# Patient Record
Sex: Female | Born: 1950 | Race: White | Hispanic: No | Marital: Married | Smoking: Never smoker
Health system: Southern US, Community
[De-identification: ages and names within clinical notes are randomized; demographics above are authoritative.]

## PROBLEM LIST (undated history)

## (undated) DIAGNOSIS — K589 Irritable bowel syndrome without diarrhea: Secondary | ICD-10-CM

## (undated) DIAGNOSIS — T7840XA Allergy, unspecified, initial encounter: Secondary | ICD-10-CM

## (undated) DIAGNOSIS — Z860101 Personal history of adenomatous and serrated colon polyps: Secondary | ICD-10-CM

## (undated) DIAGNOSIS — M810 Age-related osteoporosis without current pathological fracture: Secondary | ICD-10-CM

## (undated) DIAGNOSIS — Z86010 Personal history of colonic polyps: Secondary | ICD-10-CM

## (undated) DIAGNOSIS — G43909 Migraine, unspecified, not intractable, without status migrainosus: Secondary | ICD-10-CM

## (undated) DIAGNOSIS — N3281 Overactive bladder: Secondary | ICD-10-CM

## (undated) DIAGNOSIS — K219 Gastro-esophageal reflux disease without esophagitis: Secondary | ICD-10-CM

## (undated) DIAGNOSIS — G2581 Restless legs syndrome: Secondary | ICD-10-CM

## (undated) DIAGNOSIS — H04123 Dry eye syndrome of bilateral lacrimal glands: Secondary | ICD-10-CM

## (undated) DIAGNOSIS — J302 Other seasonal allergic rhinitis: Secondary | ICD-10-CM

## (undated) DIAGNOSIS — L659 Nonscarring hair loss, unspecified: Secondary | ICD-10-CM

## (undated) DIAGNOSIS — Z800 Family history of malignant neoplasm of digestive organs: Secondary | ICD-10-CM

## (undated) DIAGNOSIS — R011 Cardiac murmur, unspecified: Secondary | ICD-10-CM

## (undated) DIAGNOSIS — M170 Bilateral primary osteoarthritis of knee: Secondary | ICD-10-CM

## (undated) DIAGNOSIS — B029 Zoster without complications: Secondary | ICD-10-CM

## (undated) DIAGNOSIS — I10 Essential (primary) hypertension: Secondary | ICD-10-CM

## (undated) DIAGNOSIS — Z87898 Personal history of other specified conditions: Secondary | ICD-10-CM

## (undated) DIAGNOSIS — Z8742 Personal history of other diseases of the female genital tract: Secondary | ICD-10-CM

## (undated) DIAGNOSIS — F419 Anxiety disorder, unspecified: Secondary | ICD-10-CM

## (undated) DIAGNOSIS — M1712 Unilateral primary osteoarthritis, left knee: Secondary | ICD-10-CM

## (undated) DIAGNOSIS — I8390 Asymptomatic varicose veins of unspecified lower extremity: Secondary | ICD-10-CM

## (undated) DIAGNOSIS — B001 Herpesviral vesicular dermatitis: Secondary | ICD-10-CM

## (undated) DIAGNOSIS — R002 Palpitations: Secondary | ICD-10-CM

## (undated) DIAGNOSIS — K2970 Gastritis, unspecified, without bleeding: Secondary | ICD-10-CM

## (undated) DIAGNOSIS — K625 Hemorrhage of anus and rectum: Secondary | ICD-10-CM

## (undated) HISTORY — DX: Cardiac murmur, unspecified: R01.1

## (undated) HISTORY — DX: Hemorrhage of anus and rectum: K62.5

## (undated) HISTORY — DX: Personal history of other specified conditions: Z87.898

## (undated) HISTORY — DX: Gastritis, unspecified, without bleeding: K29.70

## (undated) HISTORY — DX: Palpitations: R00.2

## (undated) HISTORY — PX: ABDOMINAL HYSTERECTOMY: SHX81

## (undated) HISTORY — PX: COLONOSCOPY: SHX174

## (undated) HISTORY — DX: Allergy, unspecified, initial encounter: T78.40XA

## (undated) HISTORY — DX: Gastro-esophageal reflux disease without esophagitis: K21.9

## (undated) HISTORY — DX: Other seasonal allergic rhinitis: J30.2

## (undated) HISTORY — DX: Zoster without complications: B02.9

## (undated) HISTORY — DX: Personal history of colonic polyps: Z86.010

## (undated) HISTORY — DX: Essential (primary) hypertension: I10

## (undated) HISTORY — DX: Anxiety disorder, unspecified: F41.9

## (undated) HISTORY — DX: Family history of malignant neoplasm of digestive organs: Z80.0

## (undated) HISTORY — DX: Overactive bladder: N32.81

## (undated) HISTORY — DX: Asymptomatic varicose veins of unspecified lower extremity: I83.90

## (undated) HISTORY — PX: POLYPECTOMY: SHX149

## (undated) HISTORY — DX: Personal history of other diseases of the female genital tract: Z87.42

## (undated) HISTORY — DX: Nonscarring hair loss, unspecified: L65.9

## (undated) HISTORY — DX: Herpesviral vesicular dermatitis: B00.1

## (undated) HISTORY — PX: OTHER SURGICAL HISTORY: SHX169

## (undated) HISTORY — DX: Age-related osteoporosis without current pathological fracture: M81.0

## (undated) HISTORY — DX: Bilateral primary osteoarthritis of knee: M17.0

## (undated) HISTORY — DX: Migraine, unspecified, not intractable, without status migrainosus: G43.909

## (undated) HISTORY — DX: Irritable bowel syndrome, unspecified: K58.9

## (undated) HISTORY — DX: Dry eye syndrome of bilateral lacrimal glands: H04.123

## (undated) HISTORY — DX: Restless legs syndrome: G25.81

## (undated) HISTORY — DX: Personal history of adenomatous and serrated colon polyps: Z86.0101

---
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
## 1974-02-18 DIAGNOSIS — B029 Zoster without complications: Secondary | ICD-10-CM

## 1974-02-18 HISTORY — DX: Zoster without complications: B02.9

## 1981-02-18 HISTORY — PX: APPENDECTOMY: SHX54

## 2008-02-19 HISTORY — PX: OTHER SURGICAL HISTORY: SHX169

## 2012-02-19 HISTORY — PX: CHOLECYSTECTOMY, LAPAROSCOPIC: SHX56

## 2013-05-19 HISTORY — PX: ROBOTIC ASSISTED LAPAROSCOPIC SACROCOLPOPEXY: SHX5388

## 2013-07-19 HISTORY — PX: ESOPHAGOGASTRODUODENOSCOPY: SHX1529

## 2014-02-15 ENCOUNTER — Encounter: Payer: Self-pay | Admitting: Family Medicine

## 2014-02-23 ENCOUNTER — Encounter: Payer: Self-pay | Admitting: Family Medicine

## 2014-02-23 ENCOUNTER — Ambulatory Visit (INDEPENDENT_AMBULATORY_CARE_PROVIDER_SITE_OTHER): Payer: Federal, State, Local not specified - PPO | Admitting: Family Medicine

## 2014-02-23 VITALS — BP 162/87 | HR 74 | Temp 98.6°F | Resp 18 | Ht 67.0 in | Wt 147.0 lb

## 2014-02-23 DIAGNOSIS — G2581 Restless legs syndrome: Secondary | ICD-10-CM | POA: Insufficient documentation

## 2014-02-23 DIAGNOSIS — Z23 Encounter for immunization: Secondary | ICD-10-CM

## 2014-02-23 DIAGNOSIS — I10 Essential (primary) hypertension: Secondary | ICD-10-CM

## 2014-02-23 DIAGNOSIS — Z0000 Encounter for general adult medical examination without abnormal findings: Secondary | ICD-10-CM | POA: Insufficient documentation

## 2014-02-23 DIAGNOSIS — N3941 Urge incontinence: Secondary | ICD-10-CM | POA: Insufficient documentation

## 2014-02-23 MED ORDER — OXYBUTYNIN CHLORIDE ER 10 MG PO TB24: 10.0000 mg | ORAL_TABLET | Freq: Every day | ORAL | Status: DC

## 2014-02-23 MED ORDER — LOSARTAN POTASSIUM 100 MG PO TABS
100.0000 mg | ORAL_TABLET | Freq: Every day | ORAL | Status: DC
Start: 2014-02-23 — End: 2015-03-26

## 2014-02-23 MED ORDER — ROPINIROLE HCL 1 MG PO TABS: ORAL_TABLET | ORAL | Status: DC

## 2014-02-23 NOTE — Assessment & Plan Note (Addendum)
Off med x 1 wk so bp up today. Usually well controlled on losartan 100mg  qd. RF'd med today.  She continue home monitoring.

## 2014-02-23 NOTE — Progress Notes (Signed)
Office Note 02/23/2014  CC:  Chief Complaint  Patient presents with  . Establish Care    moved from [facility]   HPI:  Crystal Murray is a 64 y.o. White female who is here to establish care. Patient's most recent primary MD: Dr. Lorenz Coaster in [place], [place]. Old records were not reviewed prior to or during today's visit.  Needs refill of oxybutinynin EF , 1 qd. Takes famvir  for recurrent herpes labialis. Has been out of losartan for 1 wk: needs RF.  She recalls bp being well controlled on this dosing.  Has been titrating ropinirole: now at 0.5mg  bid.   She is much improved on this dosing.   She has both upper body and lower body symptomatology of this disorder (burning, tingling, restlessness).  Tetanus UTD: 2013 Last mammogram 01/10/13  Reviewed CMET, UA, and lipid panel brought in by pt from 08/2013 at previous MD: all wnl.   Past Medical History  Diagnosis Date  . Osteoarthritis of both knees     "bone on bone" per pt  . GERD (gastroesophageal reflux disease)   . Seasonal allergic rhinitis   . Hypertension   . Urge incontinence   . Restless legs syndrome   . Herpes zoster 1976  . Migraine syndrome     Tylenol x 2 onset, then tylenol #4 if no help, then triptan if no help.  Marland Kitchen Alopecia   . Varicose veins   . History of vertigo   . Palpitations     Echo/Holter monitoring; cardiology eval 2015 all normal.  . Recurrent herpes labialis   . Dry eyes     Past Surgical History  Procedure Laterality Date  . Robotic assisted laparoscopic sacrocolpopexy  05/2013  . Appendectomy  1983  . Tonsillectomy and adenoidectomy  1966  . Abdominal hysterectomy  1980s    endometriosis  . Ovaries and tubes removed  1980s    a few years after uterus removed.  . Bladder tack  2010    vaginally, with mesh  . Colonoscopy  1989, 1992; 1997; q 5 yrs    Most recent 07/2010, hyperplastic polyp---recommended repeat 5-7 yrs  . Esophagogastroduodenoscopy  07/2013    gastritis; prilosec  started    Family History  Problem Relation Age of Onset  . Colon cancer Mother 26  . CVA Father 64  . CAD  22    History   Social History  . Marital Status: Married    Spouse Name: N/A    Number of Children: N/A  . Years of Education: N/A   Occupational History  . Not on file.   Social History Main Topics  . Smoking status: Never Smoker   . Smokeless tobacco: Not on file  . Alcohol Use: Not on file  . Drug Use: Not on file  . Sexual Activity: Not on file   Other Topics Concern  . Not on file   Social History Narrative   Married, has 2 sons, 1 grandson.   Relocated from [place], living in [place].   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   No T/A/Ds.    Outpatient Encounter Prescriptions as of 02/23/2014  Medication Sig  . almotriptan (AXERT) 12.5 MG tablet Take 12.5 mg by mouth as needed for migraine. may repeat in 2 hours if needed  . aspirin 81 MG tablet Take 81 mg by mouth daily.  . Biotin 5000 MCG CAPS Take 5,000 mcg by mouth.  . Calcium Carbonate-Vitamin D (CALCIUM + D PO) Take by mouth.  Marland Kitchen  famciclovir (FAMVIR) 125 MG tablet Take 50 mg by mouth daily.  Marland Kitchen loratadine (CLARITIN) 10 MG tablet Take 10 mg by mouth daily.  Marland Kitchen losartan (COZAAR) 100 MG tablet Take 1 tablet (100 mg total) by mouth daily.  . Multiple Vitamin (MULTIVITAMIN) tablet Take 1 tablet by mouth daily.  . naproxen sodium (ANAPROX) 220 MG tablet Take 220 mg by mouth 2 (two) times daily with a meal.  . Omega-3 Fatty Acids (FISH OIL) 1200 MG CAPS Take 1,200 mg by mouth.  Marland Kitchen omeprazole (PRILOSEC) 40 MG capsule   . oxybutynin (DITROPAN-XL) 10 MG 24 hr tablet Take 1 tablet (10 mg total) by mouth at bedtime.  . triamcinolone (NASACORT ALLERGY 24HR) 55 MCG/ACT AERO nasal inhaler Place 2 sprays into the nose daily.  . vitamin E 400 UNIT capsule Take 400 Units by mouth daily.  . [DISCONTINUED] losartan (COZAAR) 100 MG tablet Take 100 mg by mouth daily.  . [DISCONTINUED] oxybutynin (DITROPAN-XL) 10 MG 24 hr  tablet   . [DISCONTINUED] rOPINIRole (REQUIP) 0.5 MG tablet   . rOPINIRole (REQUIP) 1 MG tablet 1-2 tabs po bid    Allergies  Allergen Reactions  . Amitiza [Lubiprostone] Hives and Shortness Of Breath  . Biaxin [Clarithromycin] Hives and Shortness Of Breath  . Iodine Swelling    Mouth and throat swelling  . Penicillins Hives and Shortness Of Breath  . Sulfa Antibiotics Hives and Shortness Of Breath  . Versed [Midazolam]   . Zithromax [Azithromycin] Hives and Shortness Of Breath  . Brimonidine Tartrate Itching and Swelling  . Lisinopril     Hair loss  . Cefdinir Itching and Rash  . Levaquin [Levofloxacin In D5w] Itching and Rash    ROS Review of Systems  Constitutional: Negative for fever and fatigue.  HENT: Negative for congestion and sore throat.   Eyes: Negative for visual disturbance.  Respiratory: Negative for cough.   Cardiovascular: Negative for chest pain.  Gastrointestinal: Negative for nausea and abdominal pain.  Genitourinary: Negative for dysuria.  Musculoskeletal: Negative for back pain and joint swelling.  Skin: Negative for rash.  Neurological: Negative for weakness and headaches.  Hematological: Negative for adenopathy.    PE; Blood pressure 162/87, pulse 74, temperature 98.6 F (37 C), temperature source Temporal, resp. rate 18, height  (1.702 m), weight 147 lb (66.679 kg), SpO2 99 %. Gen: Alert, well appearing.  Patient is oriented to person, place, time, and situation. DGU:YQIH: no injection, icteris, swelling, or exudate.  EOMI, PERRLA. Mouth: lips without lesion/swelling.  Oral mucosa pink and moist. Oropharynx without erythema, exudate, or swelling.   Neck - No masses or thyromegaly or limitation in range of motion CV: RRR, no m/r/g.   LUNGS: CTA bilat, nonlabored resps, good aeration in all lung fields. EXT: no clubbing, cyanosis, or edema.    Pertinent labs:  None today  ASSESSMENT AND PLAN:   New pt: obtain old PCP  records.  Restless legs syndrome Improved on ropinirole but not ideally controlled, particularly her shoulders/traps tingling and burning and urge to move upper body/extremities. Increase dose to  bid, titrate up by 0.5mg  per dose q7d prn to max of  bid --hold at this dose until I see her again in 4 wks.  Essential hypertension Off med x 1 wk so bp up today. Usually well controlled on losartan  qd. RF'd med today.  She continue home monitoring.  Urge incontinence Oxybutynin  ER working ok: pt needs RF so I did this today.  Preventative health care  Pt due for mammogram: we'll set this up at next f/u appt in 1 mo. She'll need referral to a GYN surgeon who has expertise in robotic surgery to do routine f/u for her most recent GYN surgery. She'll need GI referral in the next 1-2 yrs for ongoing colon cancer screening for FH of colon cancer.  Flu vaccine IM today.  An After Visit Summary was printed and given to the patient.  Return in about 4 weeks (around 03/23/2014) for 30 min visit f/u RLS and BP.

## 2014-02-23 NOTE — Assessment & Plan Note (Signed)
Pt due for mammogram: we'll set this up at next f/u appt in 1 mo. She'll need referral to a GYN surgeon who has expertise in robotic surgery to do routine f/u for her most recent GYN surgery. She'll need GI referral in the next 1-2 yrs for ongoing colon cancer screening for FH of colon cancer.

## 2014-02-23 NOTE — Assessment & Plan Note (Signed)
Oxybutynin 10mg  ER working ok: pt needs RF so I did this today.

## 2014-02-23 NOTE — Assessment & Plan Note (Signed)
Improved on ropinirole but not ideally controlled, particularly her shoulders/traps tingling and burning and urge to move upper body/extremities. Increase dose to 1mg  bid, titrate up by 0.5mg  per dose q7d prn to max of 2mg  bid --hold at this dose until I see her again in 4 wks.

## 2014-02-23 NOTE — Progress Notes (Signed)
Pre visit review using our clinic review tool, if applicable. No additional management support is needed unless otherwise documented below in the visit note. 

## 2014-03-02 ENCOUNTER — Encounter: Payer: Self-pay | Admitting: Family Medicine

## 2014-03-02 ENCOUNTER — Telehealth: Payer: Self-pay | Admitting: Family Medicine

## 2014-03-02 NOTE — Telephone Encounter (Signed)
emmi emailed °

## 2014-03-30 ENCOUNTER — Ambulatory Visit: Payer: Federal, State, Local not specified - PPO | Admitting: Family Medicine

## 2014-04-13 ENCOUNTER — Encounter: Payer: Self-pay | Admitting: Family Medicine

## 2014-04-13 ENCOUNTER — Ambulatory Visit (INDEPENDENT_AMBULATORY_CARE_PROVIDER_SITE_OTHER): Payer: Federal, State, Local not specified - PPO | Admitting: Family Medicine

## 2014-04-13 VITALS — BP 149/80 | HR 65 | Temp 98.4°F | Ht 67.0 in | Wt 149.0 lb

## 2014-04-13 DIAGNOSIS — N3941 Urge incontinence: Secondary | ICD-10-CM

## 2014-04-13 DIAGNOSIS — I10 Essential (primary) hypertension: Secondary | ICD-10-CM

## 2014-04-13 DIAGNOSIS — Z8719 Personal history of other diseases of the digestive system: Secondary | ICD-10-CM

## 2014-04-13 DIAGNOSIS — Z1239 Encounter for other screening for malignant neoplasm of breast: Secondary | ICD-10-CM

## 2014-04-13 DIAGNOSIS — G629 Polyneuropathy, unspecified: Secondary | ICD-10-CM

## 2014-04-13 DIAGNOSIS — M792 Neuralgia and neuritis, unspecified: Secondary | ICD-10-CM

## 2014-04-13 LAB — BASIC METABOLIC PANEL
BUN: 19 mg/dL (ref 6–23)
CALCIUM: 9.5 mg/dL (ref 8.4–10.5)
CO2: 28 meq/L (ref 19–32)
Chloride: 100 mEq/L (ref 96–112)
Creat: 0.82 mg/dL (ref 0.50–1.10)
Glucose, Bld: 74 mg/dL (ref 70–99)
Potassium: 4.4 mEq/L (ref 3.5–5.3)
Sodium: 139 mEq/L (ref 135–145)

## 2014-04-13 MED ORDER — ROPINIROLE HCL 1 MG PO TABS: ORAL_TABLET | ORAL | Status: DC

## 2014-04-13 NOTE — Progress Notes (Signed)
OFFICE NOTE  04/17/2014  CC:  Chief Complaint  Patient presents with  . Follow-up    HPI: Patient is a 64 y.o. Caucasian female who is here for 6 wk f/u HTN and RLS. Increase of requip during day made her too drowsy so she is currently on 0.5mg  dose in daytime but 1mg  dose about 1 hour before bed.  Gaynelle AduSher finds this regimen satisfactory.  Oxybutynin for overactive bladder--recently restarted: she does have 2 days per week in which her tongue feels like it burns on top and she wonders if it could be this med causing it. Says tongue sometimes appears more red than normal.  Says when she used to take this med in the past she didn't have this problem. Her overactive bladder sx's are improved but not gone.  Tries to limit caffeine and chocolate.  Mirbetriq in the past; not much help and too expensive.  Home bp monitoring: 120s-130s over 60s.  No HR numbers to report.  No HAs, dizziness, CP, or palpitations.  Sides and bottoms of feet burning and stinging for at least 9 mo, comes and goes, but she sees a redness to the bottoms of her feet constantly. Intensity: moderate for the most part, able to be distracted from it throughout the course of her day, but worse at night when not distracted, makes her miserable.  Change in shoes made no difference. Worse after being on feet a long time during day.  No meds have been tried for this.  She has been on prilosec daily x 8 mo for gastritis. She wants to go off of this med.  Denies any current epigastric pain or GERD.    She takes NSAIDs on a fairly regular basis but denies abd discomfort related to this med.  Pertinent PMH:  Past medical, surgical, social, and family history reviewed and no changes are noted since last office visit.  MEDS:  Outpatient Prescriptions Prior to Visit  Medication Sig Dispense Refill  . almotriptan (AXERT) 12.5 MG tablet Take 12.5 mg by mouth as needed for migraine. may repeat in 2 hours if needed    . aspirin 81 MG  tablet Take 81 mg by mouth daily.    . Biotin 5000 MCG CAPS Take 5,000 mcg by mouth.    . Calcium Carbonate-Vitamin D (CALCIUM + D PO) Take by mouth.    . famciclovir (FAMVIR) 125 MG tablet Take 50 mg by mouth daily.    Marland Kitchen. loratadine (CLARITIN) 10 MG tablet Take 10 mg by mouth daily.    Marland Kitchen. losartan (COZAAR) 100 MG tablet Take 1 tablet (100 mg total) by mouth daily. 90 tablet 3  . Multiple Vitamin (MULTIVITAMIN) tablet Take 1 tablet by mouth daily.    . naproxen sodium (ANAPROX) 220 MG tablet Take 220 mg by mouth 2 (two) times daily with a meal.    . Omega-3 Fatty Acids (FISH OIL) 1200 MG CAPS Take 1,200 mg by mouth.    . oxybutynin (DITROPAN-XL) 10 MG 24 hr tablet Take 1 tablet (10 mg total) by mouth at bedtime. 90 tablet 3  . triamcinolone (NASACORT ALLERGY 24HR) 55 MCG/ACT AERO nasal inhaler Place 2 sprays into the nose daily.    . vitamin E 400 UNIT capsule Take 400 Units by mouth daily.    Marland Kitchen. omeprazole (PRILOSEC) 40 MG capsule   2  . rOPINIRole (REQUIP) 1 MG tablet 1-2 tabs po bid 120 tablet 2   No facility-administered medications prior to visit.    PE:  Blood pressure 149/80, pulse 65, temperature 98.4 F (36.9 C), temperature source Temporal, height  (1.702 m), weight 149 lb (67.586 kg), SpO2 100 %. Gen: Alert, well appearing.  Patient is oriented to person, place, time, and situation. ENT: tongue and oral mucosa normal CV: RRR, no m/r/g.   LUNGS: CTA bilat, nonlabored resps, good aeration in all lung fields. Feet: some varicose veins in ankles-noninflamed. No edema.  NO tenderness or swelling.  ROM of ankles and toes normal.  Sensation intact. Bottoms of feet are a deep pink hue.  IMPRESSION AND PLAN:  1) HTN; The current medical regimen is effective;  continue present plan and medications. BMET today (not fasting).  2) Burning feet, unclear etiology.  Question of early peripheral neuropathy, small fiber. Check HbA1c.  May ultimately need empiric trial of gabapentin  amitriptyline or lyrica. May also ultimately need NCS/EMGs but neuro referral may make more sense.  3) Hx of gastritis, now s/p 8 mo of daily PPI. OK to ween off prilosec over the next 1-2 wks.  4) Urge incontinence: fair response to oxybutynin.  I dont think her tongue burning is related to this med, but possibly the tongue burning is coming from the same problem causing her plantar feet burning pain.   5) Hx of robotic assisted laparoscopic sacrocolpopexy.  I gave her info on a new pelvic specialist at [facility] and she says she will look into her and others; she wants to research this and when she finds a Research scientist (medical) of her liking to get established with she'll call for referral assistance if needed.  6) Prev health care: mammogram for breast cancer screening ordered.  An After Visit Summary was printed and given to the patient.  FOLLOW UP: 6 mo

## 2014-04-13 NOTE — Progress Notes (Signed)
Pre visit review using our clinic review tool, if applicable. No additional management support is needed unless otherwise documented below in the visit note. 

## 2014-04-14 LAB — HEMOGLOBIN A1C
Hgb A1c MFr Bld: 5.4 % (ref <5.7)
Mean Plasma Glucose: 108 mg/dL (ref <117)

## 2014-04-29 ENCOUNTER — Encounter: Payer: Self-pay | Admitting: Nurse Practitioner

## 2014-04-29 ENCOUNTER — Ambulatory Visit (INDEPENDENT_AMBULATORY_CARE_PROVIDER_SITE_OTHER): Payer: Federal, State, Local not specified - PPO | Admitting: Nurse Practitioner

## 2014-04-29 VITALS — BP 118/69 | HR 70 | Temp 99.0°F | Ht 67.0 in | Wt 151.0 lb

## 2014-04-29 DIAGNOSIS — R300 Dysuria: Secondary | ICD-10-CM

## 2014-04-29 LAB — POCT URINALYSIS DIPSTICK
BILIRUBIN UA: NEGATIVE
GLUCOSE UA: NEGATIVE
KETONES UA: NEGATIVE
Nitrite, UA: NEGATIVE
PH UA: 6
Protein, UA: NEGATIVE
SPEC GRAV UA: 1.01
Urobilinogen, UA: 0.2

## 2014-04-29 MED ORDER — NITROFURANTOIN MONOHYD MACRO 100 MG PO CAPS: 100.0000 mg | ORAL_CAPSULE | Freq: Two times a day (BID) | ORAL | Status: DC

## 2014-04-29 NOTE — Progress Notes (Signed)
   Subjective:    Patient ID: Crystal Murray, female    DOB: April 29, 1950, 64 y.o.   MRN: [id number]  Dysuria  This is a new problem. The current episode started in the past 7 days (2d). The problem occurs every urination. The problem has been unchanged. The quality of the pain is described as burning. There has been no fever. Associated symptoms include frequency, hematuria and urgency. Pertinent negatives include no chills, discharge, flank pain, hesitancy or nausea. She has tried nothing for the symptoms. bladder tack 11 mos ago      Review of Systems  Constitutional: Negative for chills.  Gastrointestinal: Positive for constipation. Negative for nausea.  Genitourinary: Positive for dysuria, urgency, frequency and hematuria. Negative for hesitancy and flank pain.       Objective:   Physical Exam  Constitutional: She is oriented to person, place, and time. She appears well-developed and well-nourished. No distress.  HENT:  Head: Normocephalic and atraumatic.  Eyes: Conjunctivae are normal. Right eye exhibits no discharge. Left eye exhibits no discharge.  Neck: Normal range of motion.  Cardiovascular: Normal rate.   Pulmonary/Chest: Effort normal.  Abdominal: Soft. She exhibits no distension and no mass. There is tenderness. There is no rebound and no guarding.  LUQ  Musculoskeletal: She exhibits no tenderness (no CVA tenderness).  Neurological: She is alert and oriented to person, place, and time.  Skin: Skin is warm and dry.  Psychiatric: She has a normal mood and affect. Her behavior is normal. Thought content normal.  Vitals reviewed.         Assessment & Plan:  1. Dysuria - POCT urinalysis dipstick-mod blood, leuks - Urine culture - nitrofurantoin, macrocrystal-monohydrate, (MACROBID) 100 MG capsule; Take 1 capsule (100 mg total) by mouth 2 (two) times daily.  Dispense: 10 capsule; Refill: 0  See pt instructions. F/u PRN

## 2014-04-29 NOTE — Patient Instructions (Signed)
Start antibiotic. Our office will call if we need to change the antibiotic. Sip hydrating fluids (water, juice, colorless soda, decaff tea) every hour to flush kidneys. Return in 1 month to recheck urine or sooner if symptoms do not improve or you feel worse.  Urinary Tract Infection Urinary tract infections (UTIs) can develop anywhere along your urinary tract. Your urinary tract is your body's drainage system for removing wastes and extra water. Your urinary tract includes two kidneys, two ureters, a bladder, and a urethra. Your kidneys are a pair of bean-shaped organs. Each kidney is about the size of your fist. They are located below your ribs, one on each side of your spine. CAUSES Infections are caused by microbes, which are microscopic organisms, including fungi, viruses, and bacteria. These organisms are so small that they can only be seen through a microscope. Bacteria are the microbes that most commonly cause UTIs. SYMPTOMS  Symptoms of UTIs may vary by age and gender of the patient and by the location of the infection. Symptoms in young women typically include a frequent and intense urge to urinate and a painful, burning feeling in the bladder or urethra during urination. Older women and men are more likely to be tired, shaky, and weak and have muscle aches and abdominal pain. A fever may mean the infection is in your kidneys. Other symptoms of a kidney infection include pain in your back or sides below the ribs, nausea, and vomiting. DIAGNOSIS To diagnose a UTI, your caregiver will ask you about your symptoms. Your caregiver also will ask to provide a urine sample. The urine sample will be tested for bacteria and white blood cells. White blood cells are made by your body to help fight infection. TREATMENT  Typically, UTIs can be treated with medication. Because most UTIs are caused by a bacterial infection, they usually can be treated with the use of antibiotics. The choice of antibiotic and  length of treatment depend on your symptoms and the type of bacteria causing your infection. HOME CARE INSTRUCTIONS  If you were prescribed antibiotics, take them exactly as your caregiver instructs you. Finish the medication even if you feel better after you have only taken some of the medication.  Drink enough water and fluids to keep your urine clear or pale yellow.  Avoid caffeine, tea, and carbonated beverages. They tend to irritate your bladder.  Empty your bladder often. Avoid holding urine for long periods of time.  Empty your bladder before and after sexual intercourse.  After a bowel movement, women should cleanse from front to back. Use each tissue only once. SEEK MEDICAL CARE IF:   You have back pain.  You develop a fever.  Your symptoms do not begin to resolve within 3 days. SEEK IMMEDIATE MEDICAL CARE IF:   You have severe back pain or lower abdominal pain.  You develop chills.  You have nausea or vomiting.  You have continued burning or discomfort with urination. MAKE SURE YOU:   Understand these instructions.  Will watch your condition.  Will get help right away if you are not doing well or get worse. Document Released: 11/14/2004 Document Revised: 08/06/2011 Document Reviewed: 03/15/2011 Exit[employer] Patient Information 2014 [employer].  

## 2014-04-29 NOTE — Progress Notes (Signed)
Pre visit review using our clinic review tool, if applicable. No additional management support is needed unless otherwise documented below in the visit note. 

## 2014-05-02 LAB — URINE CULTURE: Colony Count: >=100000

## 2014-05-13 ENCOUNTER — Ambulatory Visit: Payer: Federal, State, Local not specified - PPO

## 2014-06-13 ENCOUNTER — Telehealth: Payer: Self-pay | Admitting: Family Medicine

## 2014-06-13 ENCOUNTER — Ambulatory Visit
Admission: RE | Admit: 2014-06-13 | Discharge: 2014-06-13 | Disposition: A | Discharge status: Home / Self Care | Payer: Federal, State, Local not specified - PPO | Source: Ambulatory Visit | Attending: Family Medicine | Admitting: Family Medicine

## 2014-06-13 DIAGNOSIS — Z1239 Encounter for other screening for malignant neoplasm of breast: Secondary | ICD-10-CM

## 2014-06-13 NOTE — Telephone Encounter (Signed)
Pls call pt and ask her to schedule a 30 min o/v for medical clearance for her knee surgery that is scheduled for this July (this appt can be done sometime in the next 4-6 weeks--pls ask her to fast for this appt.-thx

## 2014-06-14 NOTE — Telephone Encounter (Signed)
Appointment scheduled 07/20/14 at 10am.

## 2014-06-15 ENCOUNTER — Encounter: Payer: Self-pay | Admitting: Family Medicine

## 2014-06-17 ENCOUNTER — Encounter: Payer: Self-pay | Admitting: Family Medicine

## 2014-07-07 ENCOUNTER — Other Ambulatory Visit: Payer: Self-pay | Admitting: Orthopedic Surgery

## 2014-07-20 ENCOUNTER — Encounter: Payer: Self-pay | Admitting: Family Medicine

## 2014-07-20 ENCOUNTER — Ambulatory Visit (INDEPENDENT_AMBULATORY_CARE_PROVIDER_SITE_OTHER): Payer: Federal, State, Local not specified - PPO | Admitting: Family Medicine

## 2014-07-20 VITALS — BP 127/78 | HR 63 | Temp 97.7°F | Resp 16 | Wt 154.0 lb

## 2014-07-20 DIAGNOSIS — I10 Essential (primary) hypertension: Secondary | ICD-10-CM

## 2014-07-20 DIAGNOSIS — Z8679 Personal history of other diseases of the circulatory system: Secondary | ICD-10-CM

## 2014-07-20 DIAGNOSIS — M170 Bilateral primary osteoarthritis of knee: Secondary | ICD-10-CM

## 2014-07-20 DIAGNOSIS — E539 Vitamin B deficiency, unspecified: Secondary | ICD-10-CM

## 2014-07-20 DIAGNOSIS — Z13220 Encounter for screening for lipoid disorders: Secondary | ICD-10-CM

## 2014-07-20 DIAGNOSIS — Z87898 Personal history of other specified conditions: Secondary | ICD-10-CM

## 2014-07-20 LAB — LIPID PANEL
Cholesterol: 184 mg/dL (ref 0–200)
HDL: 63.6 mg/dL (ref >39.00)
LDL CALC: 93 mg/dL (ref 0–99)
NonHDL: 120.4
Total CHOL/HDL Ratio: 3
Triglycerides: 137 mg/dL (ref 0.0–149.0)
VLDL: 27.4 mg/dL (ref 0.0–40.0)

## 2014-07-20 LAB — COMPREHENSIVE METABOLIC PANEL
ALT: 15 U/L (ref 0–35)
AST: 20 U/L (ref 0–37)
Albumin: 4.3 g/dL (ref 3.5–5.2)
Alkaline Phosphatase: 62 U/L (ref 39–117)
BUN: 20 mg/dL (ref 6–23)
CALCIUM: 9.5 mg/dL (ref 8.4–10.5)
CHLORIDE: 106 meq/L (ref 96–112)
CO2: 32 meq/L (ref 19–32)
CREATININE: 0.78 mg/dL (ref 0.40–1.20)
GFR: 79.05 mL/min (ref >60.00)
GLUCOSE: 90 mg/dL (ref 70–99)
POTASSIUM: 4.8 meq/L (ref 3.5–5.1)
Sodium: 141 mEq/L (ref 135–145)
Total Bilirubin: 0.5 mg/dL (ref 0.2–1.2)
Total Protein: 6.6 g/dL (ref 6.0–8.3)

## 2014-07-20 LAB — CBC WITH DIFFERENTIAL/PLATELET
BASOS PCT: 0.3 % (ref 0.0–3.0)
Basophils Absolute: 0 10*3/uL (ref 0.0–0.1)
EOS PCT: 1.5 % (ref 0.0–5.0)
Eosinophils Absolute: 0.1 10*3/uL (ref 0.0–0.7)
HCT: 43 % (ref 36.0–46.0)
Hemoglobin: 14 g/dL (ref 12.0–15.0)
LYMPHS ABS: 1.8 10*3/uL (ref 0.7–4.0)
Lymphocytes Relative: 26.3 % (ref 12.0–46.0)
MCHC: 32.5 g/dL (ref 30.0–36.0)
MCV: 89.7 fl (ref 78.0–100.0)
Monocytes Absolute: 0.3 10*3/uL (ref 0.1–1.0)
Monocytes Relative: 5.2 % (ref 3.0–12.0)
NEUTROS ABS: 4.5 10*3/uL (ref 1.4–7.7)
NEUTROS PCT: 66.7 % (ref 43.0–77.0)
Platelets: 180 10*3/uL (ref 150.0–400.0)
RBC: 4.8 Mil/uL (ref 3.87–5.11)
RDW: 13.7 % (ref 11.5–15.5)
WBC: 6.7 10*3/uL (ref 4.0–10.5)

## 2014-07-20 LAB — TSH: TSH: 2.71 u[IU]/mL (ref 0.35–4.50)

## 2014-07-20 NOTE — Progress Notes (Signed)
Pre visit review using our clinic review tool, if applicable. No additional management support is needed unless otherwise documented below in the visit note. 

## 2014-07-20 NOTE — Progress Notes (Signed)
Office Note 07/21/2014  CC:  Chief Complaint  Patient presents with  . Medical Clearance    Knee surgery scheduled for 08/30/14 surgeon Dr. Dion SaucierLandau   HPI:  Crystal Murray is a 64 y.o. White female who is here for pre-operative medical clearance for upcoming left TKA with Dr. Dion SaucierLandau on 08/30/14.  I last saw her 4 mo ago at which time she was overall very medically stable but had some bothersome redness and burning of bottoms of both feet.  This seems to be much better currently, on no meds for this.  She says she has had problems with chest pains and palpitations "since I was 18". Multiple stress tests (most recent she recalls was 2006, nuclear testing was normal).  Last year EKG and echo were normal.  Multiple holters/event monitors have turned up normal results. Nothing new is occuring.  No SOB, no diaphoresis.     Past Medical History  Diagnosis Date  . Osteoarthritis of both knees     "bone on bone" per pt (steroid injections bilat -multiple- by Dr. Dion SaucierLandau, and plan is for left TKR summer 2016)  . GERD (gastroesophageal reflux disease)   . Seasonal allergic rhinitis   . Hypertension   . Urge incontinence   . Restless legs syndrome   . Herpes zoster 1976  . Migraine syndrome     Tylenol x 2 onset, then tylenol #4 if no help, then triptan if no help.  Marland Kitchen. Alopecia   . Varicose veins   . History of vertigo   . Palpitations     Echo/Holter monitoring; cardiology eval 2015 all normal.  . Recurrent herpes labialis   . Dry eyes   . Gastritis EGD 07/2013    H pylori neg (mild chronic gastritis)  . IBS (irritable bowel syndrome)     Constipation-predominant    Past Surgical History  Procedure Laterality Date  . Robotic assisted laparoscopic sacrocolpopexy  05/2013  . Appendectomy  1983  . Tonsillectomy and adenoidectomy  1966  . Abdominal hysterectomy  1980s    endometriosis  . Ovaries and tubes removed  1980s    a few years after uterus removed.  . Bladder tack  2010     vaginally, with mesh  . Colonoscopy  1989, 1992; 1997; q 5 yrs    Most recent 07/2010, hyperplastic polyp---recommended repeat 5-7 yrs  . Esophagogastroduodenoscopy  07/2013    gastritis; prilosec started    Family History  Problem Relation Age of Onset  . Colon cancer Mother 4360    ? blood clot? per ortho notes--need to clarify with pt  . CVA Father 868  . CAD  6670    History   Social History  . Marital Status: Married    Spouse Name: N/A  . Number of Children: N/A  . Years of Education: N/A   Occupational History  . Not on file.   Social History Main Topics  . Smoking status: Never Smoker   . Smokeless tobacco: Not on file  . Alcohol Use: Not on file  . Drug Use: Not on file  . Sexual Activity: Not on file   Other Topics Concern  . Not on file   Social History Narrative   Married, has 2 sons, 1 grandson.   Relocated from [place] 2015, living in [place].   Occupation: retired from [employer].   No T/A/Ds.    Outpatient Prescriptions Prior to Visit  Medication Sig Dispense Refill  . acetaminophen-codeine (TYLENOL #4) 300-60 MG per tablet  Take 1 tablet by mouth as needed for pain.    Marland Kitchen aspirin 81 MG tablet Take 81 mg by mouth daily.    . Biotin 5000 MCG CAPS Take 5,000 mcg by mouth.    . Calcium Carbonate-Vitamin D (CALCIUM + D PO) Take by mouth.    . famciclovir (FAMVIR) 125 MG tablet Take 50 mg by mouth daily.    Marland Kitchen loratadine (CLARITIN) 10 MG tablet Take 10 mg by mouth daily.    Marland Kitchen losartan (COZAAR) 100 MG tablet Take 1 tablet (100 mg total) by mouth daily. 90 tablet 3  . meclizine (ANTIVERT) 25 MG tablet Take 25 mg by mouth as needed for dizziness.    . Multiple Vitamin (MULTIVITAMIN) tablet Take 1 tablet by mouth daily.    . naproxen sodium (ANAPROX) 220 MG tablet Take 220 mg by mouth 2 (two) times daily with a meal.    . oxybutynin (DITROPAN-XL) 10 MG 24 hr tablet Take 1 tablet (10 mg total) by mouth at bedtime. 90 tablet 3  . rOPINIRole (REQUIP) 1 MG tablet 1/2  tab po qAM and 1 tab po qhs 120 tablet 2  . SUMAtriptan (IMITREX) 100 MG tablet Take 100 mg by mouth as needed for migraine or headache. May repeat in 2 hours if headache persists or recurs.    . triamcinolone (NASACORT ALLERGY 24HR) 55 MCG/ACT AERO nasal inhaler Place 2 sprays into the nose daily.    . vitamin E 400 UNIT capsule Take 400 Units by mouth daily.    Marland Kitchen almotriptan (AXERT) 12.5 MG tablet Take 12.5 mg by mouth as needed for migraine. may repeat in 2 hours if needed    . Omega-3 Fatty Acids (FISH OIL) 1200 MG CAPS Take 1,200 mg by mouth.    Bertram Gala Glycol-Propyl Glycol 0.4-0.3 % SOLN Apply to eye. 1 Drop Each Eye 2 To 3 Times A Day    . nitrofurantoin, macrocrystal-monohydrate, (MACROBID) 100 MG capsule Take 1 capsule (100 mg total) by mouth 2 (two) times daily. (Patient not taking: Reported on 07/20/2014) 10 capsule 0   No facility-administered medications prior to visit.    Allergies  Allergen Reactions  . Amitiza [Lubiprostone] Hives and Shortness Of Breath  . Biaxin [Clarithromycin] Hives and Shortness Of Breath  . Iodine Swelling    Mouth and throat swelling  . Penicillins Hives and Shortness Of Breath  . Sulfa Antibiotics Hives and Shortness Of Breath  . Versed [Midazolam]   . Zithromax [Azithromycin] Hives and Shortness Of Breath  . Brimonidine Tartrate Itching and Swelling  . Lisinopril     Hair loss  . Cefdinir Itching and Rash  . Levaquin [Levofloxacin In D5w] Itching and Rash    ROS Review of Systems  Constitutional: Negative for fever, chills, appetite change and fatigue.  HENT: Negative for congestion, dental problem, ear pain and sore throat.   Eyes: Negative for discharge, redness and visual disturbance.  Respiratory: Negative for cough, chest tightness, shortness of breath and wheezing.   Cardiovascular: Negative for chest pain, palpitations and leg swelling.  Gastrointestinal: Negative for nausea, vomiting, abdominal pain, diarrhea and blood in stool.   Genitourinary: Negative for dysuria, urgency, frequency, hematuria, flank pain and difficulty urinating.  Musculoskeletal: Negative for myalgias, back pain, joint swelling, arthralgias and neck stiffness.  Skin: Negative for pallor and rash.  Neurological: Negative for dizziness, speech difficulty, weakness and headaches.  Hematological: Negative for adenopathy. Does not bruise/bleed easily.  Psychiatric/Behavioral: Negative for confusion and sleep disturbance. The patient  is not nervous/anxious.     PE; Blood pressure 127/78, pulse 63, temperature 97.7 F (36.5 C), temperature source Oral, resp. rate 16, weight 154 lb (69.854 kg), SpO2 100 %. Gen: Alert, well appearing.  Patient is oriented to person, place, time, and situation. AFFECT: pleasant, lucid thought and speech. ENT: Ears: EACs clear, normal epithelium.  TMs with good light reflex and landmarks bilaterally.  Eyes: no injection, icteris, swelling, or exudate.  EOMI, PERRLA. Nose: no drainage or turbinate edema/swelling.  No injection or focal lesion.  Mouth: lips without lesion/swelling.  Oral mucosa pink and moist.  Dentition intact and without obvious caries or gingival swelling.  Oropharynx without erythema, exudate, or swelling.  Neck: supple/nontender.  No LAD, mass, or TM.  Carotid pulses 2+ bilaterally, without bruits. CV: RRR, no m/r/g.   LUNGS: CTA bilat, nonlabored resps, good aeration in all lung fields. ABD: soft, NT, ND, BS normal.  No hepatospenomegaly or mass.  No bruits. EXT: no clubbing, cyanosis, or edema.  Musculoskeletal: no joint swelling, erythema, warmth, or tenderness.  ROM of all joints intact. Skin - no sores or suspicious lesions or rashes or color changes  Pertinent labs:  none  ASSESSMENT AND PLAN:   Medically stable pt, chronic medical problems stable, no need for any further w/u at this time regarding pre-operative clearance for upcoming TKA surgery.  However, will do routine blood monitoring  today: CBC, FLP, CMET, TSH. No med changes.  An After Visit Summary was printed and given to the patient.  Spent 30 min with pt today, with >50% of this time spent in counseling and care coordination regarding the above problems.  FOLLOW UP:  Return in about 6 months (around 01/19/2015) for routine chronic illness f/u (fasting not necessary).

## 2014-07-21 ENCOUNTER — Encounter: Payer: Self-pay | Admitting: Family Medicine

## 2014-08-17 ENCOUNTER — Encounter (HOSPITAL_COMMUNITY)
Admission: RE | Admit: 2014-08-17 | Discharge: 2014-08-17 | Disposition: A | Discharge status: Home / Self Care | Payer: Federal, State, Local not specified - PPO | Source: Ambulatory Visit | Attending: Orthopedic Surgery | Admitting: Orthopedic Surgery

## 2014-08-17 ENCOUNTER — Encounter (HOSPITAL_COMMUNITY): Payer: Self-pay

## 2014-08-17 DIAGNOSIS — Z79899 Other long term (current) drug therapy: Secondary | ICD-10-CM | POA: Diagnosis not present

## 2014-08-17 DIAGNOSIS — Z01818 Encounter for other preprocedural examination: Secondary | ICD-10-CM | POA: Insufficient documentation

## 2014-08-17 DIAGNOSIS — Z01812 Encounter for preprocedural laboratory examination: Secondary | ICD-10-CM | POA: Insufficient documentation

## 2014-08-17 DIAGNOSIS — K219 Gastro-esophageal reflux disease without esophagitis: Secondary | ICD-10-CM | POA: Insufficient documentation

## 2014-08-17 DIAGNOSIS — Z01810 Encounter for preprocedural cardiovascular examination: Secondary | ICD-10-CM | POA: Insufficient documentation

## 2014-08-17 DIAGNOSIS — Z7982 Long term (current) use of aspirin: Secondary | ICD-10-CM | POA: Insufficient documentation

## 2014-08-17 DIAGNOSIS — I10 Essential (primary) hypertension: Secondary | ICD-10-CM | POA: Diagnosis not present

## 2014-08-17 LAB — CBC
HCT: 43.2 % (ref 36.0–46.0)
Hemoglobin: 14.4 g/dL (ref 12.0–15.0)
MCH: 29.7 pg (ref 26.0–34.0)
MCHC: 33.3 g/dL (ref 30.0–36.0)
MCV: 89.1 fL (ref 78.0–100.0)
PLATELETS: 186 10*3/uL (ref 150–400)
RBC: 4.85 MIL/uL (ref 3.87–5.11)
RDW: 13.3 % (ref 11.5–15.5)
WBC: 7 10*3/uL (ref 4.0–10.5)

## 2014-08-17 LAB — BASIC METABOLIC PANEL
Anion gap: 9 (ref 5–15)
BUN: 17 mg/dL (ref 6–20)
CHLORIDE: 101 mmol/L (ref 101–111)
CO2: 30 mmol/L (ref 22–32)
Calcium: 9.6 mg/dL (ref 8.9–10.3)
Creatinine, Ser: 0.94 mg/dL (ref 0.44–1.00)
GFR calc Af Amer: >60 mL/min (ref >60)
GFR calc non Af Amer: >60 mL/min (ref >60)
Glucose, Bld: 97 mg/dL (ref 65–99)
POTASSIUM: 3.7 mmol/L (ref 3.5–5.1)
Sodium: 140 mmol/L (ref 135–145)

## 2014-08-17 LAB — SURGICAL PCR SCREEN
MRSA, PCR: NEGATIVE
Staphylococcus aureus: POSITIVE — AB

## 2014-08-17 NOTE — Pre-Procedure Instructions (Addendum)
Rosaria FerriesKathy Lemon  08/17/2014      [facility] [id number] - [place], [place] - [address] [place] [id number] Phone: [phone] Fax: [phone]    Your procedure is scheduled on 08/30/14.  [facility] at 530 A.M.  Call this number if you have problems the morning of surgery:  [phone]   Remember:  Do not eat food or drink liquids after midnight.  Take these medicines the morning of surgery with A SIP OF WATER , oxybutin(ditropan),, imitrex if needed      STOP all herbel meds, nsaids (aleve,naproxen,advil,ibuprofen) 5 days prior to surgery starting 08/25/14 including aspirin, biotin, cal+D, multi vitamin,naproxen, vitamin E   Do not wear jewelry, make-up or nail polish.  Do not wear lotions, powders, or perfumes.  You may wear deodorant.  Do not shave 48 hours prior to surgery.  Men may shave face and neck.  Do not bring valuables to the hospital.  [facility] is not responsible for any belongings or valuables.  Contacts, dentures or bridgework may not be worn into surgery.  Leave your suitcase in the car.  After surgery it may be brought to your room.  For patients admitted to the hospital, discharge time will be determined by your treatment team.  Patients discharged the day of surgery will not be allowed to drive home.   Name and phone number of your driver:    Special instructions:  Special Instructions: [place] - Preparing for Surgery  Before surgery, you can play an important role.  Because skin is not sterile, your skin needs to be as free of germs as possible.  You can reduce the number of germs on you skin by washing with CHG (chlorahexidine gluconate) soap before surgery.  CHG is an antiseptic cleaner which kills germs and bonds with the skin to continue killing germs even after washing.  Please DO NOT use if you have an allergy to CHG or antibacterial soaps.  If  your skin becomes reddened/irritated stop using the CHG and inform your nurse when you arrive at Short Stay.  Do not shave (including legs and underarms) for at least 48 hours prior to the first CHG shower.  You may shave your face.  Please follow these instructions carefully:   1.  Shower with CHG Soap the night before surgery and the morning of Surgery.  2.  If you choose to wash your hair, wash your hair first as usual with your normal shampoo.  3.  After you shampoo, rinse your hair and body thoroughly to remove the Shampoo.  4.  Use CHG as you would any other liquid soap.  You can apply chg directly  to the skin and wash gently with scrungie or a clean washcloth.  5.  Apply the CHG Soap to your body ONLY FROM THE NECK DOWN.  Do not use on open wounds or open sores.  Avoid contact with your eyes ears, mouth and genitals (private parts).  Wash genitals (private parts)       with your normal soap.  6.  Wash thoroughly, paying special attention to the area where your surgery will be performed.  7.  Thoroughly rinse your body with warm water from the neck down.  8.  DO NOT shower/wash with your normal soap after using and rinsing off the CHG Soap.  9.  Pat yourself dry with  a clean towel.            10.  Wear clean pajamas.            11.  Place clean sheets on your bed the night of your first shower and do not sleep with pets.  Day of Surgery  Do not apply any lotions/deodorants the morning of surgery.  Please wear clean clothes to the hospital/surgery center.  Please read over the following fact sheets that you were given. Pain Booklet, Coughing and Deep Breathing, Total Joint Packet, MRSA Information and Surgical Site Infection Prevention

## 2014-08-17 NOTE — Progress Notes (Signed)
req'd echo. Office notes, any cardiac test from [facility] [place] [phone]

## 2014-08-18 NOTE — Progress Notes (Addendum)
Anesthesia Chart Review:  Pt is 64 year old female scheduled for L total knee arthroplasty on 08/30/2014 with Dr. Dion SaucierLandau.   PMH includes: HTN, palpitations, GERD. Never smoker. BMI 23.   Medications include: ASA, losartan, prilosec, requip.   Preoperative labs reviewed.    EKG 08/17/2014: NSR. Possible LA enlargement.   Pt has medical clearance from PCP, Dr. Milinda CaveMcGowen in Epic note dated 07/20/2014.   Records from [facility] [place] including echo have been requested.   Will review again when records available.   Crystal Mastngela Kabbe, FNP-BC [facility]/Anesthesiology Phone: [phone] 08/18/2014 2:37 PM  Addendum:  Received records from [place].   Holter monitor 3/12-3/17/2015: -NSR. Occasional PVCs.  -Rare PASCs.  -No atrial fibrillation, atrial flutter, ventricular tachycardia or significant pauses noted.   Echo 04/29/2013: -LV size and systolic function normal. EF estimated to be 55-65%. There were no regional wall motion abnormalities. Wall thickness was normal. LV diastolic parameters were normal.  -Aortic valve is trileaflet. Leaflets exhibited normal thickness and normal cuspal separation. There was no stenosis.  -There was no pericardial effusion.   If no changes, I anticipate pt can proceed with surgery as scheduled.   Crystal Mastngela Kabbe, FNP-BC [facility]/Anesthesiology Phone: [phone] 08/25/2014 1:14 PM

## 2014-08-23 ENCOUNTER — Other Ambulatory Visit: Payer: Self-pay | Admitting: *Deleted

## 2014-08-23 MED ORDER — ROPINIROLE HCL 1 MG PO TABS: 1.0000 mg | ORAL_TABLET | Freq: Two times a day (BID) | ORAL | Status: DC

## 2014-08-23 NOTE — Telephone Encounter (Signed)
RF request for ropinirole.  LOV: 07/20/14 Next ov: None Last written: 04/13/14 #120 w/ 2RF

## 2014-08-23 NOTE — Progress Notes (Signed)
Re-requested EKG, ECHO OV note and any cardiac records from [facility] [phone]

## 2014-08-25 ENCOUNTER — Encounter (HOSPITAL_COMMUNITY): Payer: Self-pay

## 2014-08-30 ENCOUNTER — Inpatient Hospital Stay (HOSPITAL_COMMUNITY): Payer: Federal, State, Local not specified - PPO

## 2014-08-30 ENCOUNTER — Inpatient Hospital Stay (HOSPITAL_COMMUNITY): Payer: Federal, State, Local not specified - PPO | Admitting: Emergency Medicine

## 2014-08-30 ENCOUNTER — Encounter (HOSPITAL_COMMUNITY): Payer: Self-pay | Admitting: *Deleted

## 2014-08-30 ENCOUNTER — Inpatient Hospital Stay (HOSPITAL_COMMUNITY)
Admission: RE | Admit: 2014-08-30 | Discharge: 2014-09-01 | DRG: 470 | Disposition: A | Discharge status: Home Health | Payer: Federal, State, Local not specified - PPO | Source: Ambulatory Visit | Attending: Orthopedic Surgery | Admitting: Orthopedic Surgery

## 2014-08-30 ENCOUNTER — Inpatient Hospital Stay (HOSPITAL_COMMUNITY): Payer: Federal, State, Local not specified - PPO | Admitting: Certified Registered Nurse Anesthetist

## 2014-08-30 ENCOUNTER — Encounter (HOSPITAL_COMMUNITY)
Admission: RE | Disposition: A | Discharge status: Home Health | Payer: Self-pay | Source: Ambulatory Visit | Attending: Orthopedic Surgery

## 2014-08-30 DIAGNOSIS — G2581 Restless legs syndrome: Secondary | ICD-10-CM | POA: Diagnosis present

## 2014-08-30 DIAGNOSIS — Z888 Allergy status to other drugs, medicaments and biological substances status: Secondary | ICD-10-CM | POA: Diagnosis not present

## 2014-08-30 DIAGNOSIS — Z7982 Long term (current) use of aspirin: Secondary | ICD-10-CM | POA: Diagnosis not present

## 2014-08-30 DIAGNOSIS — K219 Gastro-esophageal reflux disease without esophagitis: Secondary | ICD-10-CM | POA: Diagnosis present

## 2014-08-30 DIAGNOSIS — Z791 Long term (current) use of non-steroidal anti-inflammatories (NSAID): Secondary | ICD-10-CM | POA: Diagnosis not present

## 2014-08-30 DIAGNOSIS — Z881 Allergy status to other antibiotic agents status: Secondary | ICD-10-CM | POA: Diagnosis not present

## 2014-08-30 DIAGNOSIS — M170 Bilateral primary osteoarthritis of knee: Principal | ICD-10-CM | POA: Diagnosis present

## 2014-08-30 DIAGNOSIS — Z91041 Radiographic dye allergy status: Secondary | ICD-10-CM

## 2014-08-30 DIAGNOSIS — Z880 Allergy status to penicillin: Secondary | ICD-10-CM | POA: Diagnosis not present

## 2014-08-30 DIAGNOSIS — Z79899 Other long term (current) drug therapy: Secondary | ICD-10-CM | POA: Diagnosis not present

## 2014-08-30 DIAGNOSIS — Z90710 Acquired absence of both cervix and uterus: Secondary | ICD-10-CM | POA: Diagnosis not present

## 2014-08-30 DIAGNOSIS — I10 Essential (primary) hypertension: Secondary | ICD-10-CM | POA: Diagnosis present

## 2014-08-30 DIAGNOSIS — Z882 Allergy status to sulfonamides status: Secondary | ICD-10-CM

## 2014-08-30 DIAGNOSIS — K589 Irritable bowel syndrome without diarrhea: Secondary | ICD-10-CM | POA: Diagnosis present

## 2014-08-30 DIAGNOSIS — M1712 Unilateral primary osteoarthritis, left knee: Secondary | ICD-10-CM | POA: Diagnosis present

## 2014-08-30 DIAGNOSIS — Z91048 Other nonmedicinal substance allergy status: Secondary | ICD-10-CM | POA: Diagnosis not present

## 2014-08-30 DIAGNOSIS — Z96659 Presence of unspecified artificial knee joint: Secondary | ICD-10-CM

## 2014-08-30 DIAGNOSIS — M1711 Unilateral primary osteoarthritis, right knee: Secondary | ICD-10-CM | POA: Diagnosis present

## 2014-08-30 HISTORY — PX: TOTAL KNEE ARTHROPLASTY: SHX125

## 2014-08-30 HISTORY — DX: Unilateral primary osteoarthritis, left knee: M17.12

## 2014-08-30 SURGERY — ARTHROPLASTY, KNEE, TOTAL
Anesthesia: General | Site: Knee | Laterality: Left

## 2014-08-30 MED ORDER — SODIUM CHLORIDE 0.9 % IJ SOLN
INTRAMUSCULAR | Status: AC
  Filled 2014-08-30: qty 10

## 2014-08-30 MED ORDER — SODIUM CHLORIDE 0.9 % IR SOLN
Status: DC | PRN
  Administered 2014-08-30: 1000 mL

## 2014-08-30 MED ORDER — OXYCODONE HCL 5 MG PO TABS
ORAL_TABLET | ORAL | Status: AC
  Filled 2014-08-30: qty 2

## 2014-08-30 MED ORDER — ACETAMINOPHEN 325 MG PO TABS
650.0000 mg | ORAL_TABLET | Freq: Four times a day (QID) | ORAL | Status: DC | PRN
  Administered 2014-08-31 (×3): 650 mg via ORAL
  Filled 2014-08-30 (×2): qty 2

## 2014-08-30 MED ORDER — METHOCARBAMOL 500 MG PO TABS
500.0000 mg | ORAL_TABLET | Freq: Four times a day (QID) | ORAL | Status: DC | PRN
  Administered 2014-08-30 – 2014-08-31 (×4): 500 mg via ORAL
  Filled 2014-08-30 (×3): qty 1

## 2014-08-30 MED ORDER — METHOCARBAMOL 500 MG PO TABS
ORAL_TABLET | ORAL | Status: AC
  Filled 2014-08-30: qty 1

## 2014-08-30 MED ORDER — BISACODYL 10 MG RE SUPP: 10.0000 mg | Freq: Every day | RECTAL | Status: DC | PRN

## 2014-08-30 MED ORDER — POTASSIUM CHLORIDE IN NACL 20-0.45 MEQ/L-% IV SOLN
INTRAVENOUS | Status: DC
  Administered 2014-08-30: 12:00:00 via INTRAVENOUS
  Filled 2014-08-30: qty 1000

## 2014-08-30 MED ORDER — PROPOFOL 10 MG/ML IV BOLUS
INTRAVENOUS | Status: AC
  Filled 2014-08-30: qty 20

## 2014-08-30 MED ORDER — PHENYLEPHRINE HCL 10 MG/ML IJ SOLN
INTRAMUSCULAR | Status: DC | PRN
  Administered 2014-08-30: 80 ug via INTRAVENOUS
  Administered 2014-08-30 (×2): 40 ug via INTRAVENOUS

## 2014-08-30 MED ORDER — SODIUM CHLORIDE 0.9 % IV SOLN
INTRAVENOUS | Status: DC | PRN
  Administered 2014-08-30: 60 mL

## 2014-08-30 MED ORDER — EPHEDRINE SULFATE 50 MG/ML IJ SOLN
INTRAMUSCULAR | Status: DC | PRN
  Administered 2014-08-30: 5 mg via INTRAVENOUS
  Administered 2014-08-30: 10 mg via INTRAVENOUS

## 2014-08-30 MED ORDER — POLYETHYL GLYCOL-PROPYL GLYCOL 0.4-0.3 % OP SOLN: 1.0000 [drp] | Freq: Three times a day (TID) | OPHTHALMIC | Status: DC | PRN

## 2014-08-30 MED ORDER — FENTANYL CITRATE (PF) 250 MCG/5ML IJ SOLN
INTRAMUSCULAR | Status: AC
  Filled 2014-08-30: qty 5

## 2014-08-30 MED ORDER — HYDROMORPHONE HCL 1 MG/ML IJ SOLN
1.0000 mg | INTRAMUSCULAR | Status: DC | PRN
Start: 2014-08-30 — End: 2014-09-01
  Administered 2014-08-31: 1 mg via INTRAVENOUS
  Filled 2014-08-30: qty 1

## 2014-08-30 MED ORDER — PHENOL 1.4 % MT LIQD
1.0000 | OROMUCOSAL | Status: DC | PRN
Start: 2014-08-30 — End: 2014-09-01

## 2014-08-30 MED ORDER — DIPHENHYDRAMINE HCL 12.5 MG/5ML PO ELIX: 12.5000 mg | ORAL_SOLUTION | ORAL | Status: DC | PRN

## 2014-08-30 MED ORDER — MIDAZOLAM HCL 2 MG/2ML IJ SOLN
INTRAMUSCULAR | Status: AC
  Filled 2014-08-30: qty 2

## 2014-08-30 MED ORDER — OXYCODONE HCL 5 MG PO TABS
5.0000 mg | ORAL_TABLET | ORAL | Status: DC | PRN
  Administered 2014-08-30: 10 mg via ORAL
  Administered 2014-08-30: 5 mg via ORAL
  Administered 2014-08-30 (×2): 10 mg via ORAL
  Administered 2014-08-31: 5 mg via ORAL
  Administered 2014-08-31: 10 mg via ORAL
  Administered 2014-08-31 (×3): 5 mg via ORAL
  Administered 2014-08-31: 10 mg via ORAL
  Administered 2014-08-31: 5 mg via ORAL
  Administered 2014-09-01 (×2): 10 mg via ORAL
  Filled 2014-08-30 (×3): qty 2
  Filled 2014-08-30: qty 1
  Filled 2014-08-30: qty 2
  Filled 2014-08-30 (×2): qty 1
  Filled 2014-08-30: qty 2
  Filled 2014-08-30: qty 1
  Filled 2014-08-30 (×2): qty 2

## 2014-08-30 MED ORDER — LORATADINE 10 MG PO TABS
10.0000 mg | ORAL_TABLET | Freq: Every day | ORAL | Status: DC
  Administered 2014-08-31 – 2014-09-01 (×2): 10 mg via ORAL
  Filled 2014-08-30 (×2): qty 1

## 2014-08-30 MED ORDER — BACLOFEN 10 MG PO TABS: 10.0000 mg | ORAL_TABLET | Freq: Three times a day (TID) | ORAL | Status: DC

## 2014-08-30 MED ORDER — CEFAZOLIN SODIUM-DEXTROSE 2-3 GM-% IV SOLR
2.0000 g | Freq: Four times a day (QID) | INTRAVENOUS | Status: AC
  Administered 2014-08-30 (×2): 2 g via INTRAVENOUS
  Filled 2014-08-30 (×2): qty 50

## 2014-08-30 MED ORDER — ONDANSETRON HCL 4 MG/2ML IJ SOLN
INTRAMUSCULAR | Status: AC
  Filled 2014-08-30: qty 2

## 2014-08-30 MED ORDER — ONDANSETRON HCL 4 MG/2ML IJ SOLN
4.0000 mg | Freq: Four times a day (QID) | INTRAMUSCULAR | Status: DC | PRN
  Administered 2014-08-31: 4 mg via INTRAVENOUS
  Filled 2014-08-30: qty 2

## 2014-08-30 MED ORDER — ONDANSETRON HCL 4 MG PO TABS: 4.0000 mg | ORAL_TABLET | Freq: Three times a day (TID) | ORAL | Status: DC | PRN

## 2014-08-30 MED ORDER — ASPIRIN EC 81 MG PO TBEC
81.0000 mg | DELAYED_RELEASE_TABLET | Freq: Every day | ORAL | Status: DC
  Administered 2014-08-31: 81 mg via ORAL
  Filled 2014-08-30 (×3): qty 1

## 2014-08-30 MED ORDER — HYDROMORPHONE HCL 1 MG/ML IJ SOLN
0.2500 mg | INTRAMUSCULAR | Status: DC | PRN
  Administered 2014-08-30 (×4): 0.5 mg via INTRAVENOUS

## 2014-08-30 MED ORDER — EPHEDRINE SULFATE 50 MG/ML IJ SOLN
INTRAMUSCULAR | Status: AC
Start: 2014-08-30 — End: 2014-08-30
  Filled 2014-08-30: qty 1

## 2014-08-30 MED ORDER — ROPINIROLE HCL 1 MG PO TABS
1.0000 mg | ORAL_TABLET | Freq: Two times a day (BID) | ORAL | Status: DC
  Administered 2014-08-31 – 2014-09-01 (×3): 1 mg via ORAL
  Filled 2014-08-30 (×5): qty 1

## 2014-08-30 MED ORDER — POLYETHYLENE GLYCOL 3350 17 G PO PACK: 17.0000 g | PACK | Freq: Every day | ORAL | Status: DC | PRN

## 2014-08-30 MED ORDER — CEFAZOLIN SODIUM-DEXTROSE 2-3 GM-% IV SOLR
INTRAVENOUS | Status: AC
  Administered 2014-08-30: 2 g via INTRAVENOUS
  Filled 2014-08-30: qty 50

## 2014-08-30 MED ORDER — OXYBUTYNIN CHLORIDE ER 10 MG PO TB24
10.0000 mg | ORAL_TABLET | Freq: Every day | ORAL | Status: DC
  Administered 2014-08-31 – 2014-09-01 (×2): 10 mg via ORAL
  Filled 2014-08-30 (×2): qty 1

## 2014-08-30 MED ORDER — ONDANSETRON HCL 4 MG PO TABS
4.0000 mg | ORAL_TABLET | Freq: Four times a day (QID) | ORAL | Status: DC | PRN
  Administered 2014-08-30 – 2014-08-31 (×2): 4 mg via ORAL
  Filled 2014-08-30 (×2): qty 1

## 2014-08-30 MED ORDER — KETOROLAC TROMETHAMINE 30 MG/ML IJ SOLN
INTRAMUSCULAR | Status: AC
  Filled 2014-08-30: qty 1

## 2014-08-30 MED ORDER — RIVAROXABAN 10 MG PO TABS: 10.0000 mg | ORAL_TABLET | Freq: Every day | ORAL | Status: DC

## 2014-08-30 MED ORDER — DEXAMETHASONE SODIUM PHOSPHATE 10 MG/ML IJ SOLN
10.0000 mg | Freq: Once | INTRAMUSCULAR | Status: AC
  Administered 2014-08-31: 10 mg via INTRAVENOUS
  Filled 2014-08-30: qty 1

## 2014-08-30 MED ORDER — PROMETHAZINE HCL 25 MG/ML IJ SOLN
INTRAMUSCULAR | Status: AC
  Filled 2014-08-30: qty 1

## 2014-08-30 MED ORDER — POLYVINYL ALCOHOL 1.4 % OP SOLN
1.0000 [drp] | Freq: Three times a day (TID) | OPHTHALMIC | Status: DC | PRN
  Filled 2014-08-30: qty 15

## 2014-08-30 MED ORDER — PANTOPRAZOLE SODIUM 40 MG PO TBEC
80.0000 mg | DELAYED_RELEASE_TABLET | Freq: Every day | ORAL | Status: DC
  Administered 2014-08-31 – 2014-09-01 (×2): 80 mg via ORAL
  Filled 2014-08-30 (×2): qty 2

## 2014-08-30 MED ORDER — OXYCODONE-ACETAMINOPHEN 5-325 MG PO TABS: 1.0000 | ORAL_TABLET | Freq: Four times a day (QID) | ORAL | Status: DC | PRN

## 2014-08-30 MED ORDER — METOCLOPRAMIDE HCL 5 MG/ML IJ SOLN
5.0000 mg | Freq: Three times a day (TID) | INTRAMUSCULAR | Status: DC | PRN
  Administered 2014-08-30: 10 mg via INTRAVENOUS
  Filled 2014-08-30: qty 2

## 2014-08-30 MED ORDER — PROMETHAZINE HCL 25 MG/ML IJ SOLN
6.2500 mg | INTRAMUSCULAR | Status: DC | PRN
  Administered 2014-08-30: 6.25 mg via INTRAVENOUS

## 2014-08-30 MED ORDER — MENTHOL 3 MG MT LOZG: 1.0000 | LOZENGE | OROMUCOSAL | Status: DC | PRN

## 2014-08-30 MED ORDER — HYDROMORPHONE HCL 1 MG/ML IJ SOLN
INTRAMUSCULAR | Status: AC
  Filled 2014-08-30: qty 1

## 2014-08-30 MED ORDER — KETOROLAC TROMETHAMINE 30 MG/ML IJ SOLN
30.0000 mg | Freq: Once | INTRAMUSCULAR | Status: AC | PRN
  Administered 2014-08-30: 30 mg via INTRAVENOUS

## 2014-08-30 MED ORDER — ONDANSETRON HCL 4 MG/2ML IJ SOLN
INTRAMUSCULAR | Status: DC | PRN
  Administered 2014-08-30: 4 mg via INTRAVENOUS

## 2014-08-30 MED ORDER — TRIAMCINOLONE ACETONIDE 55 MCG/ACT NA AERO: 2.0000 | INHALATION_SPRAY | Freq: Every day | NASAL | Status: DC

## 2014-08-30 MED ORDER — ROCURONIUM BROMIDE 50 MG/5ML IV SOLN
INTRAVENOUS | Status: AC
  Filled 2014-08-30: qty 1

## 2014-08-30 MED ORDER — LOSARTAN POTASSIUM 50 MG PO TABS
100.0000 mg | ORAL_TABLET | Freq: Every day | ORAL | Status: DC
  Administered 2014-08-31 – 2014-09-01 (×2): 100 mg via ORAL
  Filled 2014-08-30 (×2): qty 2

## 2014-08-30 MED ORDER — ALUM & MAG HYDROXIDE-SIMETH 200-200-20 MG/5ML PO SUSP: 30.0000 mL | ORAL | Status: DC | PRN

## 2014-08-30 MED ORDER — PROPOFOL INFUSION 10 MG/ML OPTIME
INTRAVENOUS | Status: DC | PRN
  Administered 2014-08-30: 09:00:00 via INTRAVENOUS
  Administered 2014-08-30: 100 ug/kg/min via INTRAVENOUS

## 2014-08-30 MED ORDER — SENNA 8.6 MG PO TABS
1.0000 | ORAL_TABLET | Freq: Two times a day (BID) | ORAL | Status: DC
  Administered 2014-08-30 – 2014-09-01 (×4): 8.6 mg via ORAL
  Filled 2014-08-30 (×4): qty 1

## 2014-08-30 MED ORDER — BUPIVACAINE LIPOSOME 1.3 % IJ SUSP
20.0000 mL | INTRAMUSCULAR | Status: DC
  Filled 2014-08-30: qty 20

## 2014-08-30 MED ORDER — VITAMIN E 180 MG (400 UNIT) PO CAPS
400.0000 [IU] | ORAL_CAPSULE | Freq: Every day | ORAL | Status: DC
  Administered 2014-08-31 – 2014-09-01 (×2): 400 [IU] via ORAL
  Filled 2014-08-30 (×3): qty 1

## 2014-08-30 MED ORDER — ACETAMINOPHEN 650 MG RE SUPP: 650.0000 mg | Freq: Four times a day (QID) | RECTAL | Status: DC | PRN

## 2014-08-30 MED ORDER — RIVAROXABAN 10 MG PO TABS
10.0000 mg | ORAL_TABLET | Freq: Every day | ORAL | Status: DC
  Administered 2014-08-31 – 2014-09-01 (×2): 10 mg via ORAL
  Filled 2014-08-30 (×2): qty 1

## 2014-08-30 MED ORDER — MECLIZINE HCL 25 MG PO TABS
25.0000 mg | ORAL_TABLET | ORAL | Status: DC | PRN
  Filled 2014-08-30: qty 1

## 2014-08-30 MED ORDER — VALACYCLOVIR HCL 500 MG PO TABS
1000.0000 mg | ORAL_TABLET | Freq: Every day | ORAL | Status: DC
  Filled 2014-08-30: qty 2

## 2014-08-30 MED ORDER — MEPERIDINE HCL 25 MG/ML IJ SOLN: 6.2500 mg | INTRAMUSCULAR | Status: DC | PRN

## 2014-08-30 MED ORDER — SENNA-DOCUSATE SODIUM 8.6-50 MG PO TABS: 2.0000 | ORAL_TABLET | Freq: Every day | ORAL | Status: DC

## 2014-08-30 MED ORDER — FLUTICASONE PROPIONATE 50 MCG/ACT NA SUSP
2.0000 | Freq: Every day | NASAL | Status: DC
  Administered 2014-08-31: 2 via NASAL
  Filled 2014-08-30: qty 16

## 2014-08-30 MED ORDER — METOCLOPRAMIDE HCL 5 MG PO TABS
5.0000 mg | ORAL_TABLET | Freq: Three times a day (TID) | ORAL | Status: DC | PRN
  Administered 2014-08-31: 10 mg via ORAL
  Filled 2014-08-30: qty 2

## 2014-08-30 MED ORDER — DEXTROSE 5 % IV SOLN: 500.0000 mg | Freq: Four times a day (QID) | INTRAVENOUS | Status: DC | PRN

## 2014-08-30 MED ORDER — FENTANYL CITRATE (PF) 100 MCG/2ML IJ SOLN
INTRAMUSCULAR | Status: DC | PRN
  Administered 2014-08-30 (×2): 50 ug via INTRAVENOUS

## 2014-08-30 MED ORDER — MAGNESIUM CITRATE PO SOLN: 1.0000 | Freq: Once | ORAL | Status: AC | PRN

## 2014-08-30 MED ORDER — ACETAMINOPHEN 10 MG/ML IV SOLN
INTRAVENOUS | Status: AC
  Administered 2014-08-30: 1000 mg
  Filled 2014-08-30: qty 100

## 2014-08-30 MED ORDER — SUCCINYLCHOLINE CHLORIDE 20 MG/ML IJ SOLN
INTRAMUSCULAR | Status: AC
  Filled 2014-08-30: qty 1

## 2014-08-30 MED ORDER — DOCUSATE SODIUM 100 MG PO CAPS
100.0000 mg | ORAL_CAPSULE | Freq: Two times a day (BID) | ORAL | Status: DC
  Administered 2014-08-30 – 2014-09-01 (×4): 100 mg via ORAL
  Filled 2014-08-30 (×4): qty 1

## 2014-08-30 MED ORDER — SUMATRIPTAN SUCCINATE 100 MG PO TABS
100.0000 mg | ORAL_TABLET | ORAL | Status: DC | PRN
  Filled 2014-08-30: qty 1

## 2014-08-30 MED ORDER — LIDOCAINE HCL (CARDIAC) 20 MG/ML IV SOLN
INTRAVENOUS | Status: AC
  Filled 2014-08-30: qty 5

## 2014-08-30 MED ORDER — LACTATED RINGERS IV SOLN
INTRAVENOUS | Status: DC | PRN
  Administered 2014-08-30 (×2): via INTRAVENOUS

## 2014-08-30 SURGICAL SUPPLY — 63 items
BANDAGE ELASTIC 6 VELCRO ST LF (GAUZE/BANDAGES/DRESSINGS) ×3 IMPLANT
BANDAGE ESMARK 6X9 LF (GAUZE/BANDAGES/DRESSINGS) ×1 IMPLANT
BENZOIN TINCTURE PRP APPL 2/3 (GAUZE/BANDAGES/DRESSINGS) ×3 IMPLANT
BLADE SAG 18X100X1.27 (BLADE) ×3 IMPLANT
BLADE SAW RECIP 87.9 MT (BLADE) ×3 IMPLANT
BLADE SAW SGTL 13X75X1.27 (BLADE) ×3 IMPLANT
BNDG ESMARK 6X9 LF (GAUZE/BANDAGES/DRESSINGS) ×3
BOOTCOVER CLEANROOM LRG (PROTECTIVE WEAR) ×6 IMPLANT
BOWL SMART MIX CTS (DISPOSABLE) ×3 IMPLANT
CAP KNEE TOTAL 3 SIGMA ×3 IMPLANT
CEMENT HV SMART SET (Cement) ×6 IMPLANT
CLOSURE STERI-STRIP 1/2X4 (GAUZE/BANDAGES/DRESSINGS) ×1
CLSR STERI-STRIP ANTIMIC 1/2X4 (GAUZE/BANDAGES/DRESSINGS) ×2 IMPLANT
COVER SURGICAL LIGHT HANDLE (MISCELLANEOUS) ×3 IMPLANT
CUFF TOURNIQUET SINGLE 34IN LL (TOURNIQUET CUFF) ×3 IMPLANT
DRAPE EXTREMITY T 121X128X90 (DRAPE) ×3 IMPLANT
DRAPE IMP U-DRAPE 54X76 (DRAPES) ×3 IMPLANT
DRAPE U-SHAPE 47X51 STRL (DRAPES) ×3 IMPLANT
DRSG PAD ABDOMINAL 8X10 ST (GAUZE/BANDAGES/DRESSINGS) ×3 IMPLANT
DURAPREP 26ML APPLICATOR (WOUND CARE) ×3 IMPLANT
ELECT CAUTERY BLADE 6.4 (BLADE) ×3 IMPLANT
ELECT REM PT RETURN 9FT ADLT (ELECTROSURGICAL) ×3
ELECTRODE REM PT RTRN 9FT ADLT (ELECTROSURGICAL) ×1 IMPLANT
GAUZE SPONGE 4X4 12PLY STRL (GAUZE/BANDAGES/DRESSINGS) ×3 IMPLANT
GLOVE BIOGEL PI IND STRL 8 (GLOVE) ×1 IMPLANT
GLOVE BIOGEL PI INDICATOR 8 (GLOVE) ×2
GLOVE BIOGEL PI ORTHO PRO SZ8 (GLOVE) ×2
GLOVE ORTHO TXT STRL SZ7.5 (GLOVE) ×3 IMPLANT
GLOVE PI ORTHO PRO STRL SZ8 (GLOVE) ×1 IMPLANT
GLOVE SURG ORTHO 8.0 STRL STRW (GLOVE) ×3 IMPLANT
GOWN STRL REUS W/ TWL XL LVL3 (GOWN DISPOSABLE) ×1 IMPLANT
GOWN STRL REUS W/TWL 2XL LVL3 (GOWN DISPOSABLE) ×3 IMPLANT
GOWN STRL REUS W/TWL XL LVL3 (GOWN DISPOSABLE) ×2
HANDPIECE INTERPULSE COAX TIP (DISPOSABLE) ×2
HOOD PEEL AWAY FACE SHEILD DIS (HOOD) ×6 IMPLANT
IMMOBILIZER KNEE 22 (SOFTGOODS) ×3 IMPLANT
KIT BASIN OR (CUSTOM PROCEDURE TRAY) ×3 IMPLANT
KIT ROOM TURNOVER OR (KITS) ×3 IMPLANT
MANIFOLD NEPTUNE II (INSTRUMENTS) ×3 IMPLANT
NEEDLE 18GX1X1/2 (RX/OR ONLY) (NEEDLE) ×3 IMPLANT
NS IRRIG 1000ML POUR BTL (IV SOLUTION) ×3 IMPLANT
PACK TOTAL JOINT (CUSTOM PROCEDURE TRAY) ×3 IMPLANT
PACK UNIVERSAL I (CUSTOM PROCEDURE TRAY) ×3 IMPLANT
PAD ABD 8X10 STRL (GAUZE/BANDAGES/DRESSINGS) ×3 IMPLANT
PAD ARMBOARD 7.5X6 YLW CONV (MISCELLANEOUS) ×6 IMPLANT
PAD CAST 4YDX4 CTTN HI CHSV (CAST SUPPLIES) ×1 IMPLANT
PADDING CAST COTTON 4X4 STRL (CAST SUPPLIES) ×2
PADDING CAST COTTON 6X4 STRL (CAST SUPPLIES) ×3 IMPLANT
SET HNDPC FAN SPRY TIP SCT (DISPOSABLE) ×1 IMPLANT
SPONGE GAUZE 4X4 12PLY STER LF (GAUZE/BANDAGES/DRESSINGS) ×3 IMPLANT
SUCTION FRAZIER TIP 10 FR DISP (SUCTIONS) ×3 IMPLANT
SUT MNCRL AB 4-0 PS2 18 (SUTURE) IMPLANT
SUT VIC AB 0 CT1 27 (SUTURE) ×2
SUT VIC AB 0 CT1 27XBRD ANBCTR (SUTURE) ×1 IMPLANT
SUT VIC AB 2-0 CT1 27 (SUTURE) ×2
SUT VIC AB 2-0 CT1 TAPERPNT 27 (SUTURE) ×1 IMPLANT
SUT VIC AB 3-0 SH 8-18 (SUTURE) ×6 IMPLANT
SYR 30ML LL (SYRINGE) IMPLANT
SYR 50ML LL SCALE MARK (SYRINGE) ×3 IMPLANT
TOWEL OR 17X24 6PK STRL BLUE (TOWEL DISPOSABLE) ×3 IMPLANT
TOWEL OR 17X26 10 PK STRL BLUE (TOWEL DISPOSABLE) ×3 IMPLANT
TRAY CATH 16FR W/PLASTIC CATH (SET/KITS/TRAYS/PACK) IMPLANT
WATER STERILE IRR 1000ML POUR (IV SOLUTION) ×6 IMPLANT

## 2014-08-30 NOTE — Care Management Note (Signed)
Case Management Note  Patient Details  Name: Crystal Murray MRN: [id number] Date of Birth: 1951/02/12  Subjective/Objective:           S/p LEFT TOTAL KNEE ARTHROPLASTY   Action/Plan:  Awaits PT/OT evaluations.  Expected Discharge Date:                  Expected Discharge Plan:     In-House Referral:     Discharge planning Services  CM Consult  Post Acute Care Choice:    Choice offered to:     DME Arranged:    DME Agency:     HH Arranged:    HH Agency:     Status of Service:  In process, will continue to follow  Medicare Important Message Given:    Date Medicare IM Given:    Medicare IM give by:    Date Additional Medicare IM Given:    Additional Medicare Important Message give by:     If discussed at Long Length of Stay Meetings, dates discussed:    Additional Comments:  Governor Speckingdekunle, [name], RN 08/30/2014, 8:06 PM

## 2014-08-30 NOTE — Op Note (Signed)
DATE OF SURGERY:  08/30/2014 TIME: 9:31 AM  PATIENT NAME:  Crystal Murray   AGE: 64 y.o.    PRE-OPERATIVE DIAGNOSIS:  Left knee valgus osteoarthritis  POST-OPERATIVE DIAGNOSIS:  Same  PROCEDURE:  Procedure(s): TOTAL KNEE ARTHROPLASTY   SURGEON:  Eulas PostLANDAU,Brinly Maietta P, MD   ASSISTANT:  Janace LittenBrandon Parry, OPA-C, present and scrubbed throughout the case, critical for assistance with exposure, retraction, instrumentation, and closure.   OPERATIVE IMPLANTS: Depuy PFC Sigma, Posterior Stabilized.  Femur size 4 narrow, Tibia size 3, Patella size 38 3-peg oval button, with a 10 mm polyethylene insert.   PREOPERATIVE INDICATIONS:  Crystal Murray is a 64 y.o. year old female with end stage bone on bone degenerative arthritis of the knee who failed conservative treatment, including injections, antiinflammatories, activity modification, and assistive devices, and had significant impairment of their activities of daily living, and elected for Total Knee Arthroplasty.   The risks, benefits, and alternatives were discussed at length including but not limited to the risks of infection, bleeding, nerve injury, stiffness, blood clots, the need for revision surgery, cardiopulmonary complications, among others, and they were willing to proceed.  OPERATIVE FINDINGS AND UNIQUE ASPECTS OF THE CASE:  The knee did have significant valgus, although not too much bone loss laterally. The majority of the disease was lateral. I took 10 mm off of the distal femur, and a relatively minimal cut on the tibia. I did reach full extension although it was slightly tight, I considered whether not to take 2 more off of the femur, however I did not feel that more bone resection would be beneficial particularly in the setting of a valgus knee.  OPERATIVE DESCRIPTION:  The patient was brought to the operative room and placed in a supine position.  General anesthesia was administered.  IV antibiotics were given in the form of Ancef,  after a test dose given her multiple allergies.  The lower extremity was prepped and draped in the usual sterile fashion.  Time out was performed.  The leg was elevated and exsanguinated and the tourniquet was inflated.  Anterior quadriceps tendon splitting approach was performed.  The patella was everted and osteophytes were removed.  The anterior horn of the medial and lateral meniscus was removed.   The distal femur was opened with the drill and the intramedullary distal femoral cutting jig was utilized, set at 5 degrees resecting 10 mm off the distal femur.  Care was taken to protect the collateral ligaments.  Then the extramedullary tibial cutting jig was utilized making the appropriate cut using the anterior tibial crest as a reference building in appropriate posterior slope.  Care was taken during the cut to protect the medial and collateral ligaments.  The proximal tibia was removed along with the posterior horns of the menisci.  The PCL was sacrificed.    The extensor gap was measured and was approximately 10mm.    The distal femoral sizing jig was applied, taking care to avoid notching.  Then the 4-in-1 cutting jig was applied and the anterior and posterior femur was cut, along with the chamfer cuts.  All posterior osteophytes were removed.  The flexion gap was then measured and was symmetric with the extension gap.  I completed the distal femoral preparation using the appropriate jig to prepare the box.  The patella was then measured, and cut with the saw.  The thickness before the cut was 25 and after the cut was 16.  The proximal tibia sized and prepared accordingly with the reamer  and the punch, and then all components were trialed with the 10mm poly insert.  The knee was found to have excellent balance and full motion.    The above named components were then cemented into place and all excess cement was removed.  The real polyethylene implant was placed.  After the cement had cured I  released the tourniquet and confirmed excellent hemostasis with no major posterior vessel injury.    The knee was easily taken through a range of motion and the patella tracked well and the knee irrigated copiously and the parapatellar and subcutaneous tissue closed with vicryl, and monocryl with steri strips for the skin.  The wounds were injected with marcaine, and dressed with sterile gauze and the patient was awakened and returned to the PACU in stable and satisfactory condition.  There were no complications.  Total tourniquet time was ~75 minutes.

## 2014-08-30 NOTE — H&P (Signed)
PREOPERATIVE H&P  Chief Complaint: DJD LEFT KNEE  HPI: Crystal Murray is a 64 y.o. female who presents for preoperative history and physical with a diagnosis of DJD LEFT KNEE. Symptoms are rated as moderate to severe, and have been worsening.  This is significantly impairing activities of daily living.  She has elected for surgical management.   She has failed injections, activity modification, anti-inflammatories, and assistive devices.  Preoperative X-rays demonstrate end stage degenerative changes with osteophyte formation, loss of joint space, subchondral sclerosis.   Past Medical History  Diagnosis Date  . Osteoarthritis of both knees     "bone on bone" per pt (steroid injections bilat -multiple- by Dr. Dion SaucierLandau, and plan is for left TKR summer 2016)  . GERD (gastroesophageal reflux disease)   . Seasonal allergic rhinitis   . Hypertension   . Urge incontinence   . Restless legs syndrome   . Herpes zoster 1976  . Migraine syndrome     Tylenol x 2 onset, then tylenol #4 if no help, then triptan if no help.  Marland Kitchen. Alopecia   . Varicose veins   . History of vertigo   . Palpitations     Echo/Holter monitoring; cardiology eval 2015 all normal.  . Recurrent herpes labialis   . Dry eyes   . Gastritis EGD 07/2013    H pylori neg (mild chronic gastritis)  . IBS (irritable bowel syndrome)     Constipation-predominant  . Complication of anesthesia   . PONV (postoperative nausea and vomiting)   . Family history of adverse reaction to anesthesia     sister- "nerves"   Past Surgical History  Procedure Laterality Date  . Robotic assisted laparoscopic sacrocolpopexy  05/2013  . Appendectomy  1983  . Tonsillectomy and adenoidectomy  1966  . Abdominal hysterectomy  1980s    endometriosis  . Ovaries and tubes removed  1980s    a few years after uterus removed.  . Bladder tack  2010    vaginally, with mesh  . Colonoscopy  1989, 1992; 1997; q 5 yrs    Most recent 07/2010, hyperplastic  polyp---recommended repeat 5-7 yrs  . Esophagogastroduodenoscopy  07/2013    gastritis; prilosec started   History   Social History  . Marital Status: Married    Spouse Name: N/A  . Number of Children: N/A  . Years of Education: N/A   Social History Main Topics  . Smoking status: Never Smoker   . Smokeless tobacco: Never Used  . Alcohol Use: No  . Drug Use: No  . Sexual Activity: Not on file   Other Topics Concern  . None   Social History Narrative   Married, has 2 sons, 1 grandson.   Relocated from [place] 2015, living in [place].   Occupation: retired from [employer].   No T/A/Ds.   Family History  Problem Relation Age of Onset  . Colon cancer Mother 2460    ? blood clot? per ortho notes--need to clarify with pt  . CVA Father 5868  . CAD  70   Allergies  Allergen Reactions  . Amitiza [Lubiprostone] Hives and Shortness Of Breath  . Biaxin [Clarithromycin] Hives and Shortness Of Breath  . Iodine Swelling    Mouth and throat swelling  . Lodine [Etodolac] Anaphylaxis    swellling mouth and throat  . Penicillins Hives and Shortness Of Breath  . Sulfa Antibiotics Hives and Shortness Of Breath  . Versed [Midazolam]   . Zithromax [Azithromycin] Hives and Shortness Of Breath  .  Brimonidine Tartrate Itching and Swelling  . Lisinopril     Hair loss  . Adhesive [Tape] Rash  . Cefdinir Itching and Rash  . Levaquin [Levofloxacin In D5w] Itching and Rash   Prior to Admission medications   Medication Sig Start Date End Date Taking? Authorizing Provider  acetaminophen-codeine (TYLENOL #4) 300-60 MG per tablet Take 1 tablet by mouth as needed for pain.   Yes Historical Provider, MD  aspirin 81 MG tablet Take 81 mg by mouth daily.   Yes Historical Provider, MD  Biotin 5000 MCG CAPS Take 5,000 mcg by mouth daily.    Yes Historical Provider, MD  Calcium Carbonate-Vitamin D (CALCIUM + D PO) Take 1 tablet by mouth 2 (two) times daily.    Yes Historical Provider, MD  loratadine  (CLARITIN) 10 MG tablet Take 10 mg by mouth daily.   Yes Historical Provider, MD  losartan (COZAAR) 100 MG tablet Take 1 tablet (100 mg total) by mouth daily. 02/23/14  Yes Jeoffrey Massed, MD  Multiple Vitamin (MULTIVITAMIN) tablet Take 1 tablet by mouth daily.   Yes Historical Provider, MD  naproxen sodium (ANAPROX) 220 MG tablet Take 220 mg by mouth 2 (two) times daily with a meal.   Yes Historical Provider, MD  oxybutynin (DITROPAN-XL) 10 MG 24 hr tablet Take 1 tablet (10 mg total) by mouth at bedtime. 02/23/14  Yes Jeoffrey Massed, MD  Polyethyl Glycol-Propyl Glycol 0.4-0.3 % SOLN Place 1 drop into both eyes 3 (three) times daily as needed (dry eyes). 1 Drop Each Eye 2 To 3 Times A Day   Yes Historical Provider, MD  rOPINIRole (REQUIP) 1 MG tablet Take 1 tablet (1 mg total) by mouth 2 (two) times daily. 1 tab am 1 tab pm 08/23/14  Yes Jeoffrey Massed, MD  triamcinolone (NASACORT ALLERGY 24HR) 55 MCG/ACT AERO nasal inhaler Place 2 sprays into the nose daily.   Yes Historical Provider, MD  vitamin E 400 UNIT capsule Take 400 Units by mouth daily.   Yes Historical Provider, MD  famciclovir (FAMVIR) 125 MG tablet Take 50 mg by mouth daily as needed.     Historical Provider, MD  meclizine (ANTIVERT) 25 MG tablet Take 25 mg by mouth as needed for dizziness.    Historical Provider, MD  omeprazole (PRILOSEC) 40 MG capsule Take 40 mg by mouth daily.    Historical Provider, MD  SUMAtriptan (IMITREX) 100 MG tablet Take 100 mg by mouth as needed for migraine or headache. May repeat in 2 hours if headache persists or recurs.    Historical Provider, MD     Positive ROS: All other systems have been reviewed and were otherwise negative with the exception of those mentioned in the HPI and as above.  Physical Exam: General: Alert, no acute distress Cardiovascular: No pedal edema Respiratory: No cyanosis, no use of accessory musculature GI: No organomegaly, abdomen is soft and non-tender Skin: No lesions in  the area of chief complaint Neurologic: Sensation intact distally Psychiatric: Patient is competent for consent with normal mood and affect Lymphatic: No axillary or cervical lymphadenopathy  MUSCULOSKELETAL: left knee is valgus with arom 0-130 with crepitance and pain.  Assessment: DJD LEFT KNEE  Plan: Plan for Procedure(s): TOTAL KNEE ARTHROPLASTY  The risks benefits and alternatives were discussed with the patient including but not limited to the risks of nonoperative treatment, versus surgical intervention including infection, bleeding, nerve injury,  blood clots, cardiopulmonary complications, morbidity, mortality, among others, and they were willing to proceed.  Eulas Post, MD Cell [phone]   08/30/2014 7:18 AM

## 2014-08-30 NOTE — Anesthesia Postprocedure Evaluation (Signed)
Anesthesia Post Note  Patient: Rosaria FerriesKathy Carl  Procedure(s) Performed: Procedure(s) (LRB): TOTAL KNEE ARTHROPLASTY (Left)  Anesthesia type: Spinal  Patient location: PACU  Post pain: Pain level controlled  Post assessment: Post-op Vital signs reviewed  Last Vitals: BP 119/90 mmHg  Pulse 74  Temp(Src) 36.1 C (Oral)  Resp 13  Wt 152 lb 12.8 oz (69.31 kg)  SpO2 98%  Post vital signs: Reviewed  Level of consciousness: sedated  Complications: No apparent anesthesia complications

## 2014-08-30 NOTE — Anesthesia Procedure Notes (Signed)
Procedure Name: MAC Date/Time: 08/30/2014 7:46 AM Performed by: Rise PatienceBELL, Selso Mannor T Pre-anesthesia Checklist: Patient identified, Emergency Drugs available, Suction available and Patient being monitored Patient Re-evaluated:Patient Re-evaluated prior to inductionOxygen Delivery Method: Nasal cannula Preoxygenation: Pre-oxygenation with 100% oxygen Intubation Type: IV induction Placement Confirmation: positive ETCO2 and breath sounds checked- equal and bilateral Dental Injury: Teeth and Oropharynx as per pre-operative assessment

## 2014-08-30 NOTE — Progress Notes (Signed)
Upper denture & glasses ret to pt

## 2014-08-30 NOTE — Transfer of Care (Signed)
Immediate Anesthesia Transfer of Care Note  Patient: Crystal Murray  Procedure(s) Performed: Procedure(s): TOTAL KNEE ARTHROPLASTY (Left)  Patient Location: PACU  Anesthesia Type:MAC and Spinal  Level of Consciousness: awake, alert  and oriented  Airway & Oxygen Therapy: Patient Spontanous Breathing  Post-op Assessment: Report given to RN and Post -op Vital signs reviewed and stable  Post vital signs: Reviewed and stable  Last Vitals:  Filed Vitals:   08/30/14 0602  BP: 146/58  Pulse: 76  Temp: 36.2 C  Resp: 18    Complications: No apparent anesthesia complications

## 2014-08-30 NOTE — Addendum Note (Signed)
Addendum  created 08/30/14 1502 by Lewie LoronJohn Kayah Hecker, MD   Modules edited: Orders, PRL Based Order Sets

## 2014-08-30 NOTE — Anesthesia Preprocedure Evaluation (Addendum)
Anesthesia Evaluation  Patient identified by MRN, date of birth, ID band Patient awake    Reviewed: Allergy & Precautions, NPO status , Patient's Chart, lab work & pertinent test results  History of Anesthesia Complications (+) PONV and history of anesthetic complications  Airway Mallampati: II  TM Distance: >3 FB Neck ROM: Full    Dental no notable dental hx. (+) Dental Advisory Given   Pulmonary  breath sounds clear to auscultation  Pulmonary exam normal       Cardiovascular hypertension, Pt. on medications Normal cardiovascular examRhythm:Regular Rate:Normal     Neuro/Psych  Headaches,    GI/Hepatic GERD-  Medicated,  Endo/Other    Renal/GU      Musculoskeletal  (+) Arthritis -, Osteoarthritis,    Abdominal   Peds  Hematology   Anesthesia Other Findings   Reproductive/Obstetrics                           Anesthesia Physical Anesthesia Plan  ASA: II  Anesthesia Plan: General and Spinal   Post-op Pain Management:    Induction:   Airway Management Planned:   Additional Equipment:   Intra-op Plan:   Post-operative Plan:   Informed Consent: I have reviewed the patients History and Physical, chart, labs and discussed the procedure including the risks, benefits and alternatives for the proposed anesthesia with the patient or authorized representative who has indicated his/her understanding and acceptance.   Dental advisory given  Plan Discussed with: CRNA  Anesthesia Plan Comments:         Anesthesia Quick Evaluation

## 2014-08-30 NOTE — Progress Notes (Signed)
Consulted to review allergies with patient, specifically Lodine allergy. Pt reports anaphylactic reaction to oral iodine and no allergy to Lodine or other NSAID. Seems to be a transcription error when recording allergies previously.   Arlean Hoppingorey M. Newman PiesBall, PharmD Clinical Pharmacist Pager [phone]

## 2014-08-30 NOTE — Progress Notes (Signed)
Pharmacist Corrie here to speak w/pt re allergies. Pt says it is a "typo" re Lodine allergy.

## 2014-08-30 NOTE — Progress Notes (Signed)
Pt c/o 6-7/10 pain after 2mg  IV Dilaudid, 20 mg po OXY IR & po Robaxin (and had IV Phenergan for nausea). Says pain is more than she thought it was going to be. Dr Harriet MassonGeremoth updated-new order for IV Ofirmev & Toradol. (Will check with pharmacist re IV Toradol dt allergies. Will cont to monitor.

## 2014-08-31 ENCOUNTER — Encounter (HOSPITAL_COMMUNITY): Payer: Self-pay | Admitting: Orthopedic Surgery

## 2014-08-31 LAB — CBC
HCT: 35.3 % — ABNORMAL LOW (ref 36.0–46.0)
Hemoglobin: 11.4 g/dL — ABNORMAL LOW (ref 12.0–15.0)
MCH: 29.4 pg (ref 26.0–34.0)
MCHC: 32.3 g/dL (ref 30.0–36.0)
MCV: 91 fL (ref 78.0–100.0)
Platelets: 146 10*3/uL — ABNORMAL LOW (ref 150–400)
RBC: 3.88 MIL/uL (ref 3.87–5.11)
RDW: 13.6 % (ref 11.5–15.5)
WBC: 6 10*3/uL (ref 4.0–10.5)

## 2014-08-31 LAB — BASIC METABOLIC PANEL
Anion gap: 5 (ref 5–15)
BUN: 9 mg/dL (ref 6–20)
CALCIUM: 8.4 mg/dL — AB (ref 8.9–10.3)
CO2: 29 mmol/L (ref 22–32)
CREATININE: 0.75 mg/dL (ref 0.44–1.00)
Chloride: 105 mmol/L (ref 101–111)
GFR calc Af Amer: >60 mL/min (ref >60)
GFR calc non Af Amer: >60 mL/min (ref >60)
Glucose, Bld: 110 mg/dL — ABNORMAL HIGH (ref 65–99)
Potassium: 4 mmol/L (ref 3.5–5.1)
Sodium: 139 mmol/L (ref 135–145)

## 2014-08-31 MED ORDER — VALACYCLOVIR HCL 500 MG PO TABS: 1000.0000 mg | ORAL_TABLET | Freq: Every day | ORAL | Status: DC | PRN

## 2014-08-31 NOTE — Progress Notes (Signed)
Physical Therapy Treatment Patient Details Name: Crystal Murray MRN: [id number] DOB: 04/12/1950 Today's Date: 08/31/2014    History of Present Illness Patient is a 64 y/o female s/p L TKA. PMH includes HTN, vertigo, urge incontinence, herpes zoster.    PT Comments    Patient progressing well towards PT goals. Reviewed handout and exercises. Tolerated ambulation without nausea. Will plan for stair negotiation in AM to prepare pt for d/c home. Will continue to follow to maximize independence and mobility.   Follow Up Recommendations  Home health PT;Supervision/Assistance - 24 hour     Equipment Recommendations  None recommended by PT    Recommendations for Other Services       Precautions / Restrictions Precautions Precaution Booklet Issued: Yes (comment) Precaution Comments: Reviewed no pillow under knee Restrictions Weight Bearing Restrictions: Yes LLE Weight Bearing: Weight bearing as tolerated    Mobility  Bed Mobility Overal bed mobility: Needs Assistance Bed Mobility: Supine to Sit;Sit to Supine     Supine to sit: Supervision Sit to supine: Supervision   General bed mobility comments: Supervision for safety. HOB flat, no use of rails.  Transfers Overall transfer level: Needs assistance Equipment used: Rolling walker (2 wheeled) Transfers: Sit to/from Stand Sit to Stand: Min guard         General transfer comment: Min guard for safety. Cues for hand placement. Stood from [employer], from toilet x1.   Ambulation/Gait Ambulation/Gait assistance: Min guard Ambulation Distance (Feet): 65 Feet Assistive device: Rolling walker (2 wheeled) Gait Pattern/deviations: Step-through pattern;Decreased stance time - left;Decreased step length - right;Antalgic   Gait velocity interpretation: Below normal speed for age/gender General Gait Details: Cues for step through gait and heel strike at initial contact. No nausea.   Stairs            Wheelchair Mobility     Modified Rankin (Stroke Patients Only)       Balance Overall balance assessment: Needs assistance Sitting-balance support: Feet supported;No upper extremity supported Sitting balance-Leahy Scale: Good     Standing balance support: During functional activity Standing balance-Leahy Scale: Fair                      Cognition Arousal/Alertness: Awake/alert Behavior During Therapy: WFL for tasks assessed/performed Overall Cognitive Status: Within Functional Limits for tasks assessed                      Exercises Total Joint Exercises Towel Squeeze: Both;10 reps;Supine Short Arc Quad: Left;10 reps;Supine Heel Slides: Left;10 reps;Supine Hip ABduction/ADduction: 10 reps;Left;Supine    General Comments        Pertinent Vitals/Pain Pain Assessment: 0-10 Pain Score: 3  Pain Location: left knee Pain Descriptors / Indicators: Sore Pain Intervention(s): Monitored during session;Repositioned;Patient requesting pain meds-RN notified    Home Living                      Prior Function            PT Goals (current goals can now be found in the care plan section) Progress towards PT goals: Progressing toward goals    Frequency  7X/week    PT Plan Current plan remains appropriate    Co-evaluation             End of Session Equipment Utilized During Treatment: Gait belt Activity Tolerance: Patient tolerated treatment well Patient left: in bed;with call bell/phone within reach     Time: 4098-1191 PT  Time Calculation (min) (ACUTE ONLY): 27 min  Charges:  $Gait Training: 8-22 mins $Therapeutic Exercise: 8-22 mins                    G Codes:      Roizy Harold A Terri Rorrer 08/31/2014, 4:04 PM Mylo RedShauna Javani Spratt, PT, DPT [phone]

## 2014-08-31 NOTE — Evaluation (Signed)
Physical Therapy Evaluation Patient Details Name: Crystal Murray MRN: [id number] DOB: [birth year] Today's Date: 08/31/2014   History of Present Illness  Patient is a 64 y/o female s/p L TKA. PMH includes HTN, vertigo, urge incontinence, herpes zoster.  Clinical Impression  Patient presents with pain, nausea and post surgical deficits LLE s/p L TKA. Education provided on knee precautions and reviewed exercise handout. Pt plans to stay with her son at d/c as she does not think her husband can assist her at home. Will need to negotiate 2 steps to enter son's home. Nausea limited ambulation distance. Will continue to follow to maximize independence and mobility prior to return home.     Follow Up Recommendations Home health PT;Supervision/Assistance - 24 hour    Equipment Recommendations  None recommended by PT    Recommendations for Other Services       Precautions / Restrictions Precautions Precautions: Knee Precaution Booklet Issued: Yes (comment) Precaution Comments: Reviewed no pillow under knee Restrictions Weight Bearing Restrictions: Yes LLE Weight Bearing: Weight bearing as tolerated      Mobility  Bed Mobility Overal bed mobility: Needs Assistance Bed Mobility: Supine to Sit     Supine to sit: Min assist;HOB elevated     General bed mobility comments: Min A to bring LLE to EOB.  Transfers Overall transfer level: Needs assistance Equipment used: Rolling walker (2 wheeled) Transfers: Sit to/from Stand Sit to Stand: Min guard         General transfer comment: Min guard for safety. Cues for hand placement. Stood from [employer], from toilet x1.   Ambulation/Gait Ambulation/Gait assistance: Min assist Ambulation Distance (Feet): 60 Feet Assistive device: Rolling walker (2 wheeled) Gait Pattern/deviations: Step-through pattern;Decreased stance time - left;Decreased step length - right;Antalgic   Gait velocity interpretation: Below normal speed for  age/gender General Gait Details: Cues for knee extension during stance phase to activate quadricep. + nausea post ambulation bout.  Stairs            Wheelchair Mobility    Modified Rankin (Stroke Patients Only)       Balance Overall balance assessment: Needs assistance Sitting-balance support: Feet supported;No upper extremity supported Sitting balance-Leahy Scale: Good     Standing balance support: During functional activity Standing balance-Leahy Scale: Fair Standing balance comment: Able to perform pericare in standing without UE support for short period.                             Pertinent Vitals/Pain Pain Assessment: 0-10 Pain Score: 4  Pain Location: left knee Pain Descriptors / Indicators: Sore;Aching Pain Intervention(s): Monitored during session;Premedicated before session;Repositioned    Home Living Family/patient expects to be discharged to:: Private residence (sons house) Living Arrangements: Children Available Help at Discharge: Family Type of Home: House Home Access: Stairs to enter Entrance Stairs-Rails: None Entrance Stairs-Number of Steps: 2 Home Layout: One level Home Equipment: Environmental consultant - 2 wheels;Cane - single point      Prior Function Level of Independence: Independent         Comments: Works in the yard.     Hand Dominance        Extremity/Trunk Assessment   Upper Extremity Assessment: Defer to OT evaluation           Lower Extremity Assessment: LLE deficits/detail   LLE Deficits / Details: Limited AROM/strength 2/2 to pain and post surgery.     Communication   Communication: No difficulties  Cognition  Arousal/Alertness: Awake/alert Behavior During Therapy: WFL for tasks assessed/performed Overall Cognitive Status: Within Functional Limits for tasks assessed                      General Comments      Exercises Total Joint Exercises Ankle Circles/Pumps: Both;10 reps;Supine Quad Sets:  Both;10 reps;Supine Gluteal Sets: Both;10 reps;Supine Goniometric ROM: 11-78 degrees knee AROM      Assessment/Plan    PT Assessment Patient needs continued PT services  PT Diagnosis Difficulty walking;Acute pain   PT Problem List Decreased strength;Pain;Decreased range of motion;Decreased balance;Decreased mobility  PT Treatment Interventions Balance training;Gait training;Stair training;Functional mobility training;Therapeutic activities;Therapeutic exercise;Patient/family education;DME instruction   PT Goals (Current goals can be found in the Care Plan section) Acute Rehab PT Goals Patient Stated Goal: to return to independence PT Goal Formulation: With patient Time For Goal Achievement: 09/14/14 Potential to Achieve Goals: Good    Frequency 7X/week   Barriers to discharge Decreased caregiver support Pt plans to go stay with her son    Co-evaluation               End of Session Equipment Utilized During Treatment: Gait belt Activity Tolerance: Other (comment) (nausea.) Patient left: in bed;with call bell/phone within reach;with nursing/sitter in room Nurse Communication: Mobility status         Time: 8119-14781122-1157 PT Time Calculation (min) (ACUTE ONLY): 35 min   Charges:   PT Evaluation $Initial PT Evaluation Tier I: 1 Procedure PT Treatments $Gait Training: 8-22 mins   PT G Codes:        Monasia Lair A Alia Parsley 08/31/2014, 12:02 PM Mylo RedShauna Shereda Graw, PT, DPT [phone]

## 2014-08-31 NOTE — Progress Notes (Signed)
OT Cancellation Note  Patient Details Name: [name] MRN: [id number] DOB: [birth year]   Cancelled Treatment:    Reason Eval/Treat Not Completed: Other (comment) Pt current D/C plan is SNF. No apparent immediate acute care OT needs, therefore will defer OT to SNF. If OT eval is needed please call Acute Rehab Dept. at [phone] or text page OT at [phone].  [facility],HILLARY  Tremel Setters, OTR/L  [phone] 08/31/2014 08/31/2014, 7:53 AM

## 2014-08-31 NOTE — Progress Notes (Signed)
Patient ID: Rosaria FerriesKathy Edelen, female   DOB: [birth year], 64 y.o.   MRN: [id number]     Subjective:  Patient reports pain as mild to moderate.  Patient sitting up in bed and in no acute distress.  She denies any CP or SOB.  Patient states that she would like to go to SNF  Objective:   VITALS:   Filed Vitals:   08/30/14 2017 08/31/14 0004 08/31/14 0200 08/31/14 0516  BP: 122/51 121/43  135/52  Pulse: 67 74  85  Temp: 97.7 F (36.5 C) 98.2 F (36.8 C)  98.3 F (36.8 C)  TempSrc: Oral Oral  Oral  Resp: 16 16  16   Height:   5\' 8"  (1.727 m)   Weight:   68.947 kg (152 lb)   SpO2: 100% 100%  100%    ABD soft Sensation intact distally Dorsiflexion/Plantar flexion intact Incision: dressing C/D/I Good foot and ankle motion  Lab Results  Component Value Date   WBC 6.0 08/31/2014   HGB 11.4* 08/31/2014   HCT 35.3* 08/31/2014   MCV 91.0 08/31/2014   PLT 146* 08/31/2014   BMET    Component Value Date/Time   NA 139 08/31/2014 0418   K 4.0 08/31/2014 0418   CL 105 08/31/2014 0418   CO2 29 08/31/2014 0418   GLUCOSE 110* 08/31/2014 0418   BUN 9 08/31/2014 0418   CREATININE 0.75 08/31/2014 0418   CREATININE 0.82 04/13/2014 1107   CALCIUM 8.4* 08/31/2014 0418   GFRNONAA >60 08/31/2014 0418   GFRAA >60 08/31/2014 0418     Assessment/Plan: 1 Day Post-Op   Principal Problem:   Primary localized osteoarthritis of left knee Active Problems:   Knee osteoarthritis   Advance diet Up with therapy Plan for SNF on Friday Social work consult WBAT Plan for dressing change tomorrow   Haskel KhanDOUGLAS PARRY, BRANDON 08/31/2014, 6:59 AM  Discussed and agree with above.  Teryl LucyJoshua Ramah Langhans, MD Cell [phone]

## 2014-08-31 NOTE — Progress Notes (Signed)
Utilization Review Completed.Crystal Murray T7/13/2016  

## 2014-08-31 NOTE — Discharge Instructions (Signed)
INSTRUCTIONS AFTER JOINT REPLACEMENT  ° °o Remove items at home which could result in a fall. This includes throw rugs or furniture in walking pathways °o ICE to the affected joint every three hours while awake for 30 minutes at a time, for at least the first 3-5 days, and then as needed for pain and swelling.  Continue to use ice for pain and swelling. You may notice swelling that will progress down to the foot and ankle.  This is normal after surgery.  Elevate your leg when you are not up walking on it.   °o Continue to use the breathing machine you got in the hospital (incentive spirometer) which will help keep your temperature down.  It is common for your temperature to cycle up and down following surgery, especially at night when you are not up moving around and exerting yourself.  The breathing machine keeps your lungs expanded and your temperature down. ° ° °DIET:  As you were doing prior to hospitalization, we recommend a well-balanced diet. ° °DRESSING / WOUND CARE / SHOWERING ° °You may change your dressing 3-5 days after surgery.  Then change the dressing every day with sterile gauze.  Please use good hand washing techniques before changing the dressing.  Do not use any lotions or creams on the incision until instructed by your surgeon. ° °ACTIVITY ° °o Increase activity slowly as tolerated, but follow the weight bearing instructions below.   °o No driving for 6 weeks or until further direction given by your physician.  You cannot drive while taking narcotics.  °o No lifting or carrying greater than 10 lbs. until further directed by your surgeon. °o Avoid periods of inactivity such as sitting longer than an hour when not asleep. This helps prevent blood clots.  °o You may return to work once you are authorized by your doctor.  ° ° ° °WEIGHT BEARING  ° °Weight bearing as tolerated with assist device (walker, cane, etc) as directed, use it as long as suggested by your surgeon or therapist, typically at  least 4-6 weeks. ° ° °EXERCISES ° °Results after joint replacement surgery are often greatly improved when you follow the exercise, range of motion and muscle strengthening exercises prescribed by your doctor. Safety measures are also important to protect the joint from further injury. Any time any of these exercises cause you to have increased pain or swelling, decrease what you are doing until you are comfortable again and then slowly increase them. If you have problems or questions, call your caregiver or physical therapist for advice.  ° °Rehabilitation is important following a joint replacement. After just a few days of immobilization, the muscles of the leg can become weakened and shrink (atrophy).  These exercises are designed to build up the tone and strength of the thigh and leg muscles and to improve motion. Often times heat used for twenty to thirty minutes before working out will loosen up your tissues and help with improving the range of motion but do not use heat for the first two weeks following surgery (sometimes heat can increase post-operative swelling).  ° °These exercises can be done on a training (exercise) mat, on the floor, on a table or on a bed. Use whatever works the best and is most comfortable for you.    Use music or television while you are exercising so that the exercises are a pleasant break in your day. This will make your life better with the exercises acting as a break   in your routine that you can look forward to.   Perform all exercises about fifteen times, three times per day or as directed.  You should exercise both the operative leg and the other leg as well. ° °Exercises include: °  °• Quad Sets - Tighten up the muscle on the front of the thigh (Quad) and hold for 5-10 seconds.   °• Straight Leg Raises - With your knee straight (if you were given a brace, keep it on), lift the leg to 60 degrees, hold for 3 seconds, and slowly lower the leg.  Perform this exercise against  resistance later as your leg gets stronger.  °• Leg Slides: Lying on your back, slowly slide your foot toward your buttocks, bending your knee up off the floor (only go as far as is comfortable). Then slowly slide your foot back down until your leg is flat on the floor again.  °• Angel Wings: Lying on your back spread your legs to the side as far apart as you can without causing discomfort.  °• Hamstring Strength:  Lying on your back, push your heel against the floor with your leg straight by tightening up the muscles of your buttocks.  Repeat, but this time bend your knee to a comfortable angle, and push your heel against the floor.  You may put a pillow under the heel to make it more comfortable if necessary.  ° °A rehabilitation program following joint replacement surgery can speed recovery and prevent re-injury in the future due to weakened muscles. Contact your doctor or a physical therapist for more information on knee rehabilitation.  ° ° °CONSTIPATION ° °Constipation is defined medically as fewer than three stools per week and severe constipation as less than one stool per week.  Even if you have a regular bowel pattern at home, your normal regimen is likely to be disrupted due to multiple reasons following surgery.  Combination of anesthesia, postoperative narcotics, change in appetite and fluid intake all can affect your bowels.  ° °YOU MUST use at least one of the following options; they are listed in order of increasing strength to get the job done.  They are all available over the counter, and you may need to use some, POSSIBLY even all of these options:   ° °Drink plenty of fluids (prune juice may be helpful) and high fiber foods °Colace 100 mg by mouth twice a day  °Senokot for constipation as directed and as needed Dulcolax (bisacodyl), take with full glass of water  °Miralax (polyethylene glycol) once or twice a day as needed. ° °If you have tried all these things and are unable to have a bowel  movement in the first 3-4 days after surgery call either your surgeon or your primary doctor.   ° °If you experience loose stools or diarrhea, hold the medications until you stool forms back up.  If your symptoms do not get better within 1 week or if they get worse, check with your doctor.  If you experience "the worst abdominal pain ever" or develop nausea or vomiting, please contact the office immediately for further recommendations for treatment. ° ° °ITCHING:  If you experience itching with your medications, try taking only a single pain pill, or even half a pain pill at a time.  You can also use Benadryl over the counter for itching or also to help with sleep.  ° °TED HOSE STOCKINGS:  Use stockings on both legs until for at least 2 weeks or as   directed by physician office. They may be removed at night for sleeping. ° °MEDICATIONS:  See your medication summary on the “After Visit Summary” that nursing will review with you.  You may have some home medications which will be placed on hold until you complete the course of blood thinner medication.  It is important for you to complete the blood thinner medication as prescribed. ° °PRECAUTIONS:  If you experience chest pain or shortness of breath - call 911 immediately for transfer to the hospital emergency department.  ° °If you develop a fever greater that 101 F, purulent drainage from wound, increased redness or drainage from wound, foul odor from the wound/dressing, or calf pain - CONTACT YOUR SURGEON.   °                                                °FOLLOW-UP APPOINTMENTS:  If you do not already have a post-op appointment, please call the office for an appointment to be seen by your surgeon.  Guidelines for how soon to be seen are listed in your “After Visit Summary”, but are typically between 1-4 weeks after surgery. ° °OTHER INSTRUCTIONS:  ° °Knee Replacement:  Do not place pillow under knee, focus on keeping the knee straight while resting. CPM  instructions: 0-90 degrees, 2 hours in the morning, 2 hours in the afternoon, and 2 hours in the evening. Place foam block, curve side up under heel at all times except when in CPM or when walking.  DO NOT modify, tear, cut, or change the foam block in any way. ° °MAKE SURE YOU:  °• Understand these instructions.  °• Get help right away if you are not doing well or get worse.  ° ° °Thank you for letting us be a part of your medical care team.  It is a privilege we respect greatly.  We hope these instructions will help you stay on track for a fast and full recovery!  ° °Information on my medicine - XARELTO® (Rivaroxaban) ° °This medication education was reviewed with me or my healthcare representative as part of my discharge preparation. ° °Why was Xarelto® prescribed for you? °Xarelto® was prescribed for you to reduce the risk of blood clots forming after orthopedic surgery. The medical term for these abnormal blood clots is venous thromboembolism (VTE). ° °What do you need to know about xarelto® ? °Take your Xarelto® ONCE DAILY at the same time every day. °You may take it either with or without food. ° °If you have difficulty swallowing the tablet whole, you may crush it and mix in applesauce just prior to taking your dose. ° °Take Xarelto® exactly as prescribed by your doctor and DO NOT stop taking Xarelto® without talking to the doctor who prescribed the medication.  Stopping without other VTE prevention medication to take the place of Xarelto® may increase your risk of developing a clot. ° °After discharge, you should have regular check-up appointments with your healthcare provider that is prescribing your Xarelto®.   ° °What do you do if you miss a dose? °If you miss a dose, take it as soon as you remember on the same day then continue your regularly scheduled once daily regimen the next day. Do not take two doses of Xarelto® on the same day.  ° °Important Safety Information °A possible side effect of Xarelto®    is bleeding. You should call your healthcare provider right away if you experience any of the following: °? Bleeding from an injury or your nose that does not stop. °? Unusual colored urine (red or dark brown) or unusual colored stools (red or black). °? Unusual bruising for unknown reasons. °? A serious fall or if you hit your head (even if there is no bleeding). ° °Some medicines may interact with Xarelto® and might increase your risk of bleeding while on Xarelto®. To help avoid this, consult your healthcare provider or pharmacist prior to using any new prescription or non-prescription medications, including herbals, vitamins, non-steroidal anti-inflammatory drugs (NSAIDs) and supplements. ° °This website has more information on Xarelto®: www.xarelto.com. ° ° ° ° °

## 2014-09-01 ENCOUNTER — Encounter: Payer: Self-pay | Admitting: Family Medicine

## 2014-09-01 LAB — CBC
HEMATOCRIT: 35.5 % — AB (ref 36.0–46.0)
HEMOGLOBIN: 11.4 g/dL — AB (ref 12.0–15.0)
MCH: 29.1 pg (ref 26.0–34.0)
MCHC: 32.1 g/dL (ref 30.0–36.0)
MCV: 90.6 fL (ref 78.0–100.0)
PLATELETS: 159 10*3/uL (ref 150–400)
RBC: 3.92 MIL/uL (ref 3.87–5.11)
RDW: 13.6 % (ref 11.5–15.5)
WBC: 13.5 10*3/uL — ABNORMAL HIGH (ref 4.0–10.5)

## 2014-09-01 MED ORDER — ASPIRIN EC 81 MG PO TBEC
81.0000 mg | DELAYED_RELEASE_TABLET | Freq: Every day | ORAL | Status: DC
  Administered 2014-09-01: 81 mg via ORAL
  Filled 2014-09-01: qty 1

## 2014-09-01 NOTE — Care Management Note (Signed)
Case Management Note  Patient Details  Name: Crystal Murray MRN: [id number] Date of Birth: 1951-01-14  Subjective/Objective:     64 yr old female s/p left total knee arthroplasty.               Action/Plan: Case manager spoke with patient concerning home health and DME needs. She will be going to stay with her daughter at discharge. Patient states she has a rolling walker. 3in1 to be delivered to her room. [facility], no changes.   Expected Discharge Date:   09/01/14               Expected Discharge Plan:  Home w Home Health Services  In-House Referral:  NA  Discharge planning Services  CM Consult  Post Acute Care Choice:  Durable Medical Equipment, Home Health Choice offered to:  Patient  DME Arranged:  3-N-1 DME Agency:  [facility]  HH [facility]  Status of Service:  Completed, signed off  Medicare Important Message Given:    Date Medicare IM Given:    Medicare IM give by:    Date Additional Medicare IM Given:    Additional Medicare Important Message give by:     If discussed at Long Length of Stay Meetings, dates discussed:    Additional Comments:  Durenda GuthrieBrady, Mousa Prout Naomi, RN 09/01/2014, 10:44 AM

## 2014-09-01 NOTE — Evaluation (Signed)
  Occupational Therapy Evaluation and Discharge Patient Details Name: Rosaria FerriesKathy Imber MRN: [id number] DOB: September 02, 1950 Today's Date: 09/01/2014    History of Present Illness Patient is a 64 y/o female s/p L TKA. PMH includes HTN, vertigo, urge incontinence, herpes zoster.   Clinical Impression   This 64 yo female admitted and underwent above presents to acute OT with all education completed, we will D/C from acute OT.    Follow Up Recommendations  No OT follow up    Equipment Recommendations  None recommended by OT       Precautions / Restrictions Precautions Precautions: Knee Restrictions Weight Bearing Restrictions: No LLE Weight Bearing: Weight bearing as tolerated      Mobility Bed Mobility               General bed mobility comments: Pt up in recliner upon arrival  Transfers Overall transfer level: Needs assistance Equipment used: Rolling walker (2 wheeled) Transfers: Sit to/from Stand Sit to Stand: Supervision                   ADL                                         General ADL Comments: Overall at a S level for all BADLs     Vision Additional Comments: No change from baseline          Pertinent Vitals/Pain Pain Assessment: 0-10 Pain Score: 2  Pain Location: knee Pain Descriptors / Indicators: Aching;Sore Pain Intervention(s): Monitored during session     Hand Dominance Right   Extremity/Trunk Assessment Upper Extremity Assessment Upper Extremity Assessment: Overall WFL for tasks assessed           Communication Communication Communication: No difficulties   Cognition Arousal/Alertness: Awake/alert Behavior During Therapy: WFL for tasks assessed/performed Overall Cognitive Status: Within Functional Limits for tasks assessed                                Home Living Family/patient expects to be discharged to:: Private residence (son's house) Living Arrangements: Children  (daughter-in-law) Available Help at Discharge: Family Type of Home: House Home Access: Stairs to enter Secretary/administratorntrance Stairs-Number of Steps: 2 Entrance Stairs-Rails: None Home Layout: One level     Bathroom Shower/Tub: Walk-in Pensions consultantshower;Curtain   Bathroom Toilet: Standard     Home Equipment: Environmental consultantWalker - 2 wheels;Cane - single point;Bedside commode          Prior Functioning/Environment Level of Independence: Independent        Comments: Works in the yard.    OT Diagnosis: Generalized weakness;Acute pain         OT Goals(Current goals can be found in the care plan section) Acute Rehab OT Goals Patient Stated Goal: home today  OT Frequency:                End of Session Equipment Utilized During Treatment: Rolling walker  Activity Tolerance: Patient tolerated treatment well Patient left: in chair;with call bell/phone within reach   Time: 1003-1037 OT Time Calculation (min): 34 min Charges:  OT General Charges $OT Visit: 1 Procedure OT Evaluation $Initial OT Evaluation Tier I: 1 Procedure OT Treatments $Self Care/Home Management : 8-22 mins  Evette GeorgesLeonard, Teala Daffron Eva [phone] 09/01/2014, 12:04 PM

## 2014-09-01 NOTE — Progress Notes (Signed)
Patient ID: Crystal Murray, female   DOB: [birth year], 64 y.o.   MRN: [id number]     Subjective:  Patient reports pain as mild.  Patient states that she would like to go home to her son's house.  Denies any CP or SOB  Objective:   VITALS:   Filed Vitals:   08/31/14 1300 08/31/14 1400 08/31/14 2300 09/01/14 0500  BP: 134/46  141/49 113/82  Pulse:  76 73 74  Temp:  98 F (36.7 C) 97.9 F (36.6 C) 97.9 F (36.6 C)  TempSrc:   Oral Oral  Resp:  16 16 16   Height:      Weight:      SpO2:  95% 100% 100%    ABD soft Sensation intact distally Dorsiflexion/Plantar flexion intact Incision: dressing C/D/I and no drainage Dressing removed wound good  Lab Results  Component Value Date   WBC 13.5* 09/01/2014   HGB 11.4* 09/01/2014   HCT 35.5* 09/01/2014   MCV 90.6 09/01/2014   PLT 159 09/01/2014   BMET    Component Value Date/Time   NA 139 08/31/2014 0418   K 4.0 08/31/2014 0418   CL 105 08/31/2014 0418   CO2 29 08/31/2014 0418   GLUCOSE 110* 08/31/2014 0418   BUN 9 08/31/2014 0418   CREATININE 0.75 08/31/2014 0418   CREATININE 0.82 04/13/2014 1107   CALCIUM 8.4* 08/31/2014 0418   GFRNONAA >60 08/31/2014 0418   GFRAA >60 08/31/2014 0418     Assessment/Plan: 2 Days Post-Op   Principal Problem:   Primary localized osteoarthritis of left knee Active Problems:   Knee osteoarthritis   Advance diet Up with therapy Discharge home with home health WBAT Dry dressing PRN    DOUGLAS PARRY, BRANDON 09/01/2014, 7:05 AM  Seen and agree with above.   Teryl LucyJoshua Tosh Glaze, MD Cell [phone]

## 2014-09-01 NOTE — Discharge Summary (Signed)
Physician Discharge Summary  Patient ID: Crystal Murray MRN: [id number] DOB/AGE: Jul 18, 1950 64 y.o.  Admit date: 08/30/2014 Discharge date: 09/01/2014  Admission Diagnoses:  Primary localized osteoarthritis of left knee  Discharge Diagnoses:  Principal Problem:   Primary localized osteoarthritis of left knee Active Problems:   Knee osteoarthritis   Past Medical History  Diagnosis Date  . Osteoarthritis of both knees     "bone on bone" per pt (steroid injections bilat -multiple- by Dr. Dion SaucierLandau, and plan is for left TKR summer 2016)  . GERD (gastroesophageal reflux disease)   . Seasonal allergic rhinitis   . Hypertension   . Urge incontinence   . Restless legs syndrome   . Herpes zoster 1976  . Migraine syndrome     Tylenol x 2 onset, then tylenol #4 if no help, then triptan if no help.  Marland Kitchen. Alopecia   . Varicose veins   . History of vertigo   . Palpitations     Echo/Holter monitoring; cardiology eval 2015 all normal.  . Recurrent herpes labialis   . Dry eyes   . Gastritis EGD 07/2013    H pylori neg (mild chronic gastritis)  . IBS (irritable bowel syndrome)     Constipation-predominant  . Complication of anesthesia   . PONV (postoperative nausea and vomiting)   . Family history of adverse reaction to anesthesia     sister- "nerves"  . Primary localized osteoarthritis of left knee 08/30/2014    Surgeries: Procedure(s): TOTAL KNEE ARTHROPLASTY on 08/30/2014   Consultants (if any):    Discharged Condition: Improved  Hospital Course: Crystal FerriesKathy Quirarte is an 64 y.o. female who was admitted 08/30/2014 with a diagnosis of Primary localized osteoarthritis of left knee and went to the operating room on 08/30/2014 and underwent the above named procedures.    She was given perioperative antibiotics:  Anti-infectives    Start     Dose/Rate Route Frequency Ordered Stop   08/31/14 1130  valACYclovir (VALTREX) tablet 1,000 mg     1,000 mg Oral Daily PRN 08/31/14 1134     08/30/14  1715  valACYclovir (VALTREX) tablet 1,000 mg  Status:  Discontinued     1,000 mg Oral Daily 08/30/14 1703 08/31/14 1134   08/30/14 1715  ceFAZolin (ANCEF) IVPB 2 g/50 mL premix     2 g 100 mL/hr over 30 Minutes Intravenous Every 6 hours 08/30/14 1703 08/30/14 2321   08/30/14 0721  ceFAZolin (ANCEF) 2-3 GM-% IVPB SOLR    Comments:  Bell, Sarah   : cabinet override      08/30/14 0721 08/30/14 0818    .  She was given sequential compression devices, early ambulation, and xarelto for DVT prophylaxis.  She benefited maximally from the hospital stay and there were no complications.    Recent vital signs:  Filed Vitals:   09/01/14 0500  BP: 113/82  Pulse: 74  Temp: 97.9 F (36.6 C)  Resp: 16    Recent laboratory studies:  Lab Results  Component Value Date   HGB 11.4* 09/01/2014   HGB 11.4* 08/31/2014   HGB 14.4 08/17/2014   Lab Results  Component Value Date   WBC 13.5* 09/01/2014   PLT 159 09/01/2014   No results found for: INR Lab Results  Component Value Date   NA 139 08/31/2014   K 4.0 08/31/2014   CL 105 08/31/2014   CO2 29 08/31/2014   BUN 9 08/31/2014   CREATININE 0.75 08/31/2014   GLUCOSE 110* 08/31/2014    Discharge  Medications:     Medication List    STOP taking these medications        acetaminophen-codeine 300-60 MG per tablet  Commonly known as:  TYLENOL #4     naproxen sodium 220 MG tablet  Commonly known as:  ANAPROX      TAKE these medications        aspirin 81 MG tablet  Take 81 mg by mouth daily.     baclofen 10 MG tablet  Commonly known as:  LIORESAL  Take 1 tablet (10 mg total) by mouth 3 (three) times daily. As needed for muscle spasm     Biotin 5000 MCG Caps  Take 5,000 mcg by mouth daily.     CALCIUM + D PO  Take 1 tablet by mouth 2 (two) times daily.     famciclovir 125 MG tablet  Commonly known as:  FAMVIR  Take 50 mg by mouth daily as needed.     loratadine 10 MG tablet  Commonly known as:  CLARITIN  Take 10 mg by  mouth daily.     losartan 100 MG tablet  Commonly known as:  COZAAR  Take 1 tablet (100 mg total) by mouth daily.     meclizine 25 MG tablet  Commonly known as:  ANTIVERT  Take 25 mg by mouth as needed for dizziness.     multivitamin tablet  Take 1 tablet by mouth daily.     NASACORT ALLERGY 24HR 55 MCG/ACT Aero nasal inhaler  Generic drug:  triamcinolone  Place 2 sprays into the nose daily.     omeprazole 40 MG capsule  Commonly known as:  PRILOSEC  Take 40 mg by mouth daily.     ondansetron 4 MG tablet  Commonly known as:  ZOFRAN  Take 1 tablet (4 mg total) by mouth every 8 (eight) hours as needed for nausea or vomiting.     oxybutynin 10 MG 24 hr tablet  Commonly known as:  DITROPAN-XL  Take 1 tablet (10 mg total) by mouth at bedtime.     oxyCODONE-acetaminophen 5-325 MG per tablet  Commonly known as:  ROXICET  Take 1-2 tablets by mouth every 6 (six) hours as needed for severe pain.     Polyethyl Glycol-Propyl Glycol 0.4-0.3 % Soln  Place 1 drop into both eyes 3 (three) times daily as needed (dry eyes). 1 Drop Each Eye 2 To 3 Times A Day     rivaroxaban 10 MG Tabs tablet  Commonly known as:  XARELTO  Take 1 tablet (10 mg total) by mouth daily.     rOPINIRole 1 MG tablet  Commonly known as:  REQUIP  Take 1 tablet (1 mg total) by mouth 2 (two) times daily. 1 tab am 1 tab pm     sennosides-docusate sodium 8.6-50 MG tablet  Commonly known as:  SENOKOT-S  Take 2 tablets by mouth daily.     SUMAtriptan 100 MG tablet  Commonly known as:  IMITREX  Take 100 mg by mouth as needed for migraine or headache. May repeat in 2 hours if headache persists or recurs.     vitamin E 400 UNIT capsule  Take 400 Units by mouth daily.        Diagnostic Studies: Dg Knee Left Port  08/30/2014   CLINICAL DATA:  Postop left knee replacement.  EXAM: PORTABLE LEFT KNEE - 1-2 VIEW  COMPARISON:  None.  FINDINGS: Status post knee arthroplasty. Postoperative gas identified within the soft  tissues and joint  space. No acute fractures or subluxation.  IMPRESSION: Status post knee arthroplasty.  No adverse features.   Electronically Signed   By: Norva Pavlov M.D.   On: 08/30/2014 10:43    Disposition: Final discharge disposition not confirmed      Discharge Instructions    Weight bearing as tolerated    Complete by:  As directed            Follow-up Information    Follow up with Eulas Post, MD. Schedule an appointment as soon as possible for a visit in 2 weeks.   Specialty:  Orthopedic Surgery   Contact information:   [address] [place] [address] [phone]        Signed: Eulas Post 09/01/2014, 10:03 AM

## 2014-09-01 NOTE — Progress Notes (Signed)
Physical Therapy Treatment Patient Details Name: Crystal FerriesKathy Murray MRN: [id number] DOB: [birth year] Today's Date: 09/01/2014    History of Present Illness Patient is a 64 y/o female s/p L TKA. PMH includes HTN, vertigo, urge incontinence, herpes zoster.    PT Comments    Performed stair training this session. Instructed pt on how to have family assist with stabilizing RW to get into the house. Discussed how to get into car safely. Reviewed exercise handout. Pt's gait mechanics improving from prior session. Pt plans to d/c today. Safe to discharge from a mobility stand point. Will follow up if still in hospital to maximize independence.   Follow Up Recommendations  Home health PT;Supervision/Assistance - 24 hour     Equipment Recommendations  None recommended by PT    Recommendations for Other Services       Precautions / Restrictions Precautions Precautions: Knee Precaution Booklet Issued: Yes (comment) Precaution Comments: Reviewed no pillow under knee Restrictions Weight Bearing Restrictions: Yes LLE Weight Bearing: Weight bearing as tolerated    Mobility  Bed Mobility               General bed mobility comments: Sitting in chair upon PT arrival.   Transfers Overall transfer level: Needs assistance Equipment used: Rolling walker (2 wheeled) Transfers: Sit to/from Stand Sit to Stand: Supervision         General transfer comment: Supervision for safety.   Ambulation/Gait Ambulation/Gait assistance: Supervision Ambulation Distance (Feet): 100 Feet Assistive device: Rolling walker (2 wheeled) Gait Pattern/deviations: Step-through pattern;Decreased stride length;Decreased stance time - left;Decreased step length - right;Antalgic   Gait velocity interpretation: <1.8 ft/sec, indicative of risk for recurrent falls General Gait Details: Cues for knee extension during stance phase for quad activation.    Stairs Stairs: Yes Stairs assistance: Min assist Stair  Management: Backwards;With walker;Step to pattern Number of Stairs: 3 General stair comments: Cues for technique. Min A to stabilize walker when going backwards up steps.   Wheelchair Mobility    Modified Rankin (Stroke Patients Only)       Balance Overall balance assessment: Needs assistance Sitting-balance support: Feet supported;No upper extremity supported Sitting balance-Leahy Scale: Good     Standing balance support: During functional activity Standing balance-Leahy Scale: Fair                      Cognition Arousal/Alertness: Awake/alert Behavior During Therapy: WFL for tasks assessed/performed Overall Cognitive Status: Within Functional Limits for tasks assessed                      Exercises Total Joint Exercises Goniometric ROM: 5-78 degrees knee AROM.    General Comments        Pertinent Vitals/Pain Pain Assessment: No/denies pain Pain Score: 2  Pain Location: knee Pain Descriptors / Indicators: Aching;Sore Pain Intervention(s): Monitored during session    Home Living Family/patient expects to be discharged to:: Private residence (son's house) Living Arrangements: Children (daughter-in-law) Available Help at Discharge: Family Type of Home: House Home Access: Stairs to enter Entrance Stairs-Rails: None Home Layout: One level Home Equipment: Environmental consultantWalker - 2 wheels;Cane - single point;Bedside commode      Prior Function Level of Independence: Independent      Comments: Works in the yard.   PT Goals (current goals can now be found in the care plan section) Acute Rehab PT Goals Patient Stated Goal: home today Progress towards PT goals: Progressing toward goals    Frequency  7X/week  PT Plan Current plan remains appropriate    Co-evaluation             End of Session Equipment Utilized During Treatment: Gait belt Activity Tolerance: Patient tolerated treatment well Patient left: in chair;with call bell/phone within  reach     Time: 1610-9604 PT Time Calculation (min) (ACUTE ONLY): 19 min  Charges:  $Gait Training: 8-22 mins                    G Codes:      Crystal Murray A Crystal Murray 09/01/2014, 12:10 PM Crystal Murray, PT, DPT [phone]

## 2014-09-22 ENCOUNTER — Encounter: Payer: Self-pay | Admitting: Family Medicine

## 2014-09-28 ENCOUNTER — Encounter: Payer: Self-pay | Admitting: Family Medicine

## 2014-09-28 ENCOUNTER — Ambulatory Visit (INDEPENDENT_AMBULATORY_CARE_PROVIDER_SITE_OTHER): Payer: Federal, State, Local not specified - PPO | Admitting: Family Medicine

## 2014-09-28 VITALS — BP 134/78 | HR 93 | Temp 98.1°F | Resp 16 | Ht 67.0 in | Wt 146.0 lb

## 2014-09-28 DIAGNOSIS — R509 Fever, unspecified: Secondary | ICD-10-CM

## 2014-09-28 DIAGNOSIS — R059 Cough, unspecified: Secondary | ICD-10-CM

## 2014-09-28 DIAGNOSIS — R05 Cough: Secondary | ICD-10-CM

## 2014-09-28 DIAGNOSIS — J069 Acute upper respiratory infection, unspecified: Secondary | ICD-10-CM | POA: Diagnosis not present

## 2014-09-28 LAB — POCT RAPID STREP A (OFFICE): Rapid Strep A Screen: NEGATIVE

## 2014-09-28 MED ORDER — DOXYCYCLINE HYCLATE 100 MG PO TABS: 100.0000 mg | ORAL_TABLET | Freq: Two times a day (BID) | ORAL | Status: DC

## 2014-09-28 NOTE — Progress Notes (Signed)
Pre visit review using our clinic review tool, if applicable. No additional management support is needed unless otherwise documented below in the visit note. 

## 2014-09-28 NOTE — Progress Notes (Signed)
OFFICE NOTE  09/28/2014  CC:  Chief Complaint  Patient presents with  . Sore Throat    x 6 days   HPI: Patient is a 64 y.o. Caucasian female who is here for sore throat. Onset 5-6 days ago, raspy throat, gradually worsening soreness in throat, has PND and cough.  Diffuse body aches onset yesterday, but no HA.  Tm 99-subjective fevers last night.  No n/v.   She tried delsym x 1 for cough.   Pertinent PMH:  Past medical, surgical, social, and family history reviewed and no changes are noted since last office visit.  MEDS:  Outpatient Prescriptions Prior to Visit  Medication Sig Dispense Refill  . aspirin 81 MG tablet Take 81 mg by mouth daily.    . baclofen (LIORESAL) 10 MG tablet Take 1 tablet (10 mg total) by mouth 3 (three) times daily. As needed for muscle spasm 50 tablet 0  . Biotin 5000 MCG CAPS Take 5,000 mcg by mouth daily.     . Calcium Carbonate-Vitamin D (CALCIUM + D PO) Take 1 tablet by mouth 2 (two) times daily.     . famciclovir (FAMVIR) 125 MG tablet Take 50 mg by mouth daily as needed.     . loratadine (CLARITIN) 10 MG tablet Take 10 mg by mouth daily.    Marland Kitchen losartan (COZAAR) 100 MG tablet Take 1 tablet (100 mg total) by mouth daily. 90 tablet 3  . meclizine (ANTIVERT) 25 MG tablet Take 25 mg by mouth as needed for dizziness.    . Multiple Vitamin (MULTIVITAMIN) tablet Take 1 tablet by mouth daily.    Marland Kitchen omeprazole (PRILOSEC) 40 MG capsule Take 40 mg by mouth daily.    . ondansetron (ZOFRAN) 4 MG tablet Take 1 tablet (4 mg total) by mouth every 8 (eight) hours as needed for nausea or vomiting. 30 tablet 0  . oxybutynin (DITROPAN-XL) 10 MG 24 hr tablet Take 1 tablet (10 mg total) by mouth at bedtime. 90 tablet 3  . Polyethyl Glycol-Propyl Glycol 0.4-0.3 % SOLN Place 1 drop into both eyes 3 (three) times daily as needed (dry eyes). 1 Drop Each Eye 2 To 3 Times A Day    . rOPINIRole (REQUIP) 1 MG tablet Take 1 tablet (1 mg total) by mouth 2 (two) times daily. 1 tab am 1 tab  pm 120 tablet 2  . SUMAtriptan (IMITREX) 100 MG tablet Take 100 mg by mouth as needed for migraine or headache. May repeat in 2 hours if headache persists or recurs.    . triamcinolone (NASACORT ALLERGY 24HR) 55 MCG/ACT AERO nasal inhaler Place 2 sprays into the nose daily.    . vitamin E 400 UNIT capsule Take 400 Units by mouth daily.    Marland Kitchen oxyCODONE-acetaminophen (ROXICET) 5-325 MG per tablet Take 1-2 tablets by mouth every 6 (six) hours as needed for severe pain. (Patient not taking: Reported on 09/28/2014) 75 tablet 0  . rivaroxaban (XARELTO) 10 MG TABS tablet Take 1 tablet (10 mg total) by mouth daily. (Patient not taking: Reported on 09/28/2014) 21 tablet 0  . sennosides-docusate sodium (SENOKOT-S) 8.6-50 MG tablet Take 2 tablets by mouth daily. (Patient not taking: Reported on 09/28/2014) 30 tablet 1   No facility-administered medications prior to visit.    PE: Blood pressure 134/78, pulse 93, temperature 98.1 F (36.7 C), temperature source Oral, resp. rate 16, height  (1.702 m), weight 146 lb (66.225 kg), SpO2 98 %. VS: noted--normal. Gen: alert, NAD, NONTOXIC but tired- APPEARING.  HEENT: eyes without injection, drainage, or swelling.  Ears: EACs clear, TMs with normal light reflex and landmarks.  Nose: Clear rhinorrhea, with some dried, crusty exudate adherent to edematous mucosa.  No purulent d/c.  No paranasal sinus TTP.  No facial swelling.  Throat and mouth without focal lesion.  No pharyngial swelling, erythema, or exudate.   Neck: supple, no with mild symmetric jugulodigastric LAD.   LUNGS: CTA bilat, nonlabored resps.  Some post-exhalation coughing is noted. CV: RRR, no m/r/g. EXT: no c/c/e SKIN: no rash  LAB: Rapid strep neg today Recent MRSA nasal testing in hospital 07/2014 was NEG but nasal "staph" was positive and she was given mupirocin to administer nasally x 5d.  IMPRESSION AND PLAN:  Worsening URI with cough, some systemic sx's lately. Start doxycycline 100 mg  bid (lots of antibiotic allergies listed) x 10d. Mucinex DM for cough, tylenol or motrin prn ST/body aches. Signs/symptoms to call or return for were reviewed and pt expressed understanding. Group A strep culture was sent today.  An After Visit Summary was printed and given to the patient.  FOLLOW UP: prn

## 2014-09-28 NOTE — Patient Instructions (Signed)
Mucinex DM OTC: take as directed on packaging.

## 2014-09-30 LAB — CULTURE, GROUP A STREP: Organism ID, Bacteria: NORMAL

## 2014-10-11 ENCOUNTER — Ambulatory Visit: Payer: Federal, State, Local not specified - PPO | Admitting: Family Medicine

## 2014-12-12 ENCOUNTER — Encounter: Payer: Self-pay | Admitting: Family Medicine

## 2014-12-12 ENCOUNTER — Ambulatory Visit (INDEPENDENT_AMBULATORY_CARE_PROVIDER_SITE_OTHER): Payer: Federal, State, Local not specified - PPO | Admitting: Family Medicine

## 2014-12-12 VITALS — BP 138/75 | HR 69 | Temp 98.2°F | Resp 20 | Wt 157.2 lb

## 2014-12-12 DIAGNOSIS — N3941 Urge incontinence: Secondary | ICD-10-CM

## 2014-12-12 DIAGNOSIS — R8290 Unspecified abnormal findings in urine: Secondary | ICD-10-CM | POA: Diagnosis not present

## 2014-12-12 DIAGNOSIS — R300 Dysuria: Secondary | ICD-10-CM | POA: Insufficient documentation

## 2014-12-12 DIAGNOSIS — R319 Hematuria, unspecified: Secondary | ICD-10-CM | POA: Insufficient documentation

## 2014-12-12 LAB — POCT URINALYSIS DIPSTICK
Bilirubin, UA: NEGATIVE
Glucose, UA: NEGATIVE
KETONES UA: NEGATIVE
Nitrite, UA: NEGATIVE
Protein, UA: NEGATIVE
Spec Grav, UA: <=1.005
Urobilinogen, UA: 0.2
pH, UA: 5.5

## 2014-12-12 LAB — URINALYSIS, ROUTINE W REFLEX MICROSCOPIC
Bilirubin Urine: NEGATIVE
KETONES UR: NEGATIVE
Nitrite: NEGATIVE
PH: 6 (ref 5.0–8.0)
TOTAL PROTEIN, URINE-UPE24: NEGATIVE
URINE GLUCOSE: NEGATIVE
Urobilinogen, UA: 0.2 (ref 0.0–1.0)

## 2014-12-12 MED ORDER — NITROFURANTOIN MONOHYD MACRO 100 MG PO CAPS: 100.0000 mg | ORAL_CAPSULE | Freq: Two times a day (BID) | ORAL | Status: DC

## 2014-12-12 MED ORDER — PHENAZOPYRIDINE HCL 100 MG PO TABS: 100.0000 mg | ORAL_TABLET | Freq: Three times a day (TID) | ORAL | Status: DC | PRN

## 2014-12-12 NOTE — Progress Notes (Signed)
Subjective:    Patient ID: Crystal Murray, female    DOB: [birth year], 64 y.o.   MRN: [id number]  HPI  Dysuria: Patient presents with a 5-6 day complaint of increased suprapubic pressure and a "painful sensation" with urination. Patient endorses incomplete emptying sensation of bladder. Mild urinary frequency. She denies any fever, chills, CVA tenderness, vaginal irritation or discharge. She is eating and taking well, tolerating by mouth. Patient states she had a urinary tract infection back in March, after her knee surgery. Patient states that she has had UTIs in the past when she was younger. Urinary tract infection in March, was pansensitive Escherichia coli. Patient has no history of kidney stones. Patient does have a history of urge incontinence, and prior abdominal surgery/bladder lift/mesh. Patient has your urogynecologist appointment next month to establish.   Past Medical History  Diagnosis Date  . Osteoarthritis of both knees     "bone on bone" per pt--got TKA on left 09/01/14 (Dr. Dion SaucierLandau)  . GERD (gastroesophageal reflux disease)   . Seasonal allergic rhinitis   . Hypertension   . Urge incontinence   . Restless legs syndrome   . Herpes zoster 1976  . Migraine syndrome     Tylenol x 2 onset, then tylenol #4 if no help, then triptan if no help.  Marland Kitchen. Alopecia   . Varicose veins   . History of vertigo   . Palpitations     Echo/Holter monitoring; cardiology eval 2015 all normal.  . Recurrent herpes labialis   . Dry eyes   . Gastritis EGD 07/2013    H pylori neg (mild chronic gastritis)  . IBS (irritable bowel syndrome)     Constipation-predominant  . Primary localized osteoarthritis of left knee 08/30/2014    Left TKA 08/30/14 (Dr. Dion SaucierLandau)   Allergies  Allergen Reactions  . Amitiza [Lubiprostone] Hives and Shortness Of Breath  . Biaxin [Clarithromycin] Hives and Shortness Of Breath  . Iodine Swelling    Mouth and throat swelling  . Penicillins Hives and Shortness Of Breath    . Sulfa Antibiotics Hives and Shortness Of Breath  . Versed [Midazolam]   . Zithromax [Azithromycin] Hives and Shortness Of Breath  . Brimonidine Tartrate Itching and Swelling  . Lisinopril     Hair loss  . Adhesive [Tape] Rash  . Cefdinir Itching and Rash  . Levaquin [Levofloxacin In D5w] Itching and Rash   Social History   Social History  . Marital Status: Married    Spouse Name: N/A  . Number of Children: N/A  . Years of Education: N/A   Occupational History  . Not on file.   Social History Main Topics  . Smoking status: Never Smoker   . Smokeless tobacco: Never Used  . Alcohol Use: No  . Drug Use: No  . Sexual Activity: Not on file   Other Topics Concern  . Not on file   Social History Narrative   Married, has 2 sons, 1 grandson.   Relocated from [place] 2015, living in [place].   Occupation: retired from [employer].   No T/A/Ds.    Review of Systems Negative, with the exception of above mentioned in HPI   Objective:   Physical Exam BP 138/75 mmHg  Pulse 69  Temp(Src) 98.2 F (36.8 C) (Oral)  Resp 20  Wt 157 lb 4 oz (71.328 kg)  SpO2 100% Gen: Afebrile. No acute distress. Nontoxic in appearance, well-developed, well-nourished, Caucasian female. Eyes:Pupils Equal Round Reactive to light, Extraocular movements intact,  Conjunctiva  without redness, discharge or icterus. CV: RRR  Chest: CTAB, no wheeze or crackles Abd: Soft. Round. ND. Mild suprapubic tenderness BS present. No Masses palpated.  Neuro: Normal gait. PERLA. EOMi. Alert. Oriented x3    Assessment & Plan:  1. Burning with urination - POCT Urinalysis Dipstick: Urine: Trace of blood, nit neg, small - Urine culture and sensitivities sent - nitrofurantoin, macrocrystal-monohydrate, (MACROBID) 100 MG capsule; Take 1 capsule (100 mg total) by mouth 2 (two) times daily.  Dispense: 14 capsule; Refill: 0 - phenazopyridine (PYRIDIUM) 100 MG tablet; Take 1 tablet (100 mg total) by mouth 3 (three) times  daily as needed for pain.  Dispense: 10 tablet; Refill: 0  2. Hematuria Per above - Pt following with uro gyn for hematuria/urinary urge incontinence.   F/U 1 week if no improvement

## 2014-12-12 NOTE — Patient Instructions (Addendum)
Urinary Tract Infection Urinary tract infections (UTIs) can develop anywhere along your urinary tract. Your urinary tract is your body's drainage system for removing wastes and extra water. Your urinary tract includes two kidneys, two ureters, a bladder, and a urethra. Your kidneys are a pair of bean-shaped organs. Each kidney is about the size of your fist. They are located below your ribs, one on each side of your spine. CAUSES Infections are caused by microbes, which are microscopic organisms, including fungi, viruses, and bacteria. These organisms are so small that they can only be seen through a microscope. Bacteria are the microbes that most commonly cause UTIs. SYMPTOMS  Symptoms of UTIs may vary by age and gender of the patient and by the location of the infection. Symptoms in young women typically include a frequent and intense urge to urinate and a painful, burning feeling in the bladder or urethra during urination. Older women and men are more likely to be tired, shaky, and weak and have muscle aches and abdominal pain. A fever may mean the infection is in your kidneys. Other symptoms of a kidney infection include pain in your back or sides below the ribs, nausea, and vomiting. DIAGNOSIS To diagnose a UTI, your caregiver will ask you about your symptoms. Your caregiver will also ask you to provide a urine sample. The urine sample will be tested for bacteria and white blood cells. White blood cells are made by your body to help fight infection. TREATMENT  Typically, UTIs can be treated with medication. Because most UTIs are caused by a bacterial infection, they usually can be treated with the use of antibiotics. The choice of antibiotic and length of treatment depend on your symptoms and the type of bacteria causing your infection. HOME CARE INSTRUCTIONS  If you were prescribed antibiotics, take them exactly as your caregiver instructs you. Finish the medication even if you feel better after  you have only taken some of the medication.  Drink enough water and fluids to keep your urine clear or pale yellow.  Avoid caffeine, tea, and carbonated beverages. They tend to irritate your bladder.  Empty your bladder often. Avoid holding urine for long periods of time.  Empty your bladder before and after sexual intercourse.  After a bowel movement, women should cleanse from front to back. Use each tissue only once. SEEK MEDICAL CARE IF:   You have back pain.  You develop a fever.  Your symptoms do not begin to resolve within 3 days. SEEK IMMEDIATE MEDICAL CARE IF:   You have severe back pain or lower abdominal pain.  You develop chills.  You have nausea or vomiting.  You have continued burning or discomfort with urination. MAKE SURE YOU:   Understand these instructions.  Will watch your condition.  Will get help right away if you are not doing well or get worse.   This information is not intended to replace advice given to you by your health care provider. Make sure you discuss any questions you have with your health care provider.   Document Released: 11/14/2004 Document Revised: 10/26/2014 Document Reviewed: 03/15/2011 Elsevier Interactive Patient Education [employer]  I have called in macrobid and Pyridium, Only use pyridium for a few days at most. If symptoms do not resolve in 1 week, or you develop a fever, not tolerate food/drink/meds/ than we will need to see you again.

## 2014-12-13 ENCOUNTER — Telehealth: Payer: Self-pay | Admitting: *Deleted

## 2014-12-13 LAB — URINE CULTURE

## 2014-12-13 NOTE — Telephone Encounter (Signed)
She should continue her Macrobid for her urinary tract infection, have yet to receive the urine culture sensitivities. If she continues to have urinary symptoms, or increased lower back pain, increased pressure pain near her bladder, decreased urine then we would need to consider changing her antibiotic, importantly she is allergic to many of antibiotics that we would consider. She feels her symptoms are more related to congestion, then taking Tylenol or over-the-counter relief is adequate and she is feeling she is getting a sinus infection, in which we would need to see her.

## 2014-12-13 NOTE — Telephone Encounter (Signed)
Left message with instructions on patient voice mail. 

## 2014-12-13 NOTE — Telephone Encounter (Signed)
Patients states she has chills,fever (101) and congestion. Taking Macrobid as directed for UTI. Patient taking tylenol for fever and took OTC decongestant wants to know if she should take anything else for her Sx. Please advise.

## 2014-12-14 ENCOUNTER — Telehealth: Payer: Self-pay | Admitting: Family Medicine

## 2014-12-14 NOTE — Telephone Encounter (Signed)
Spoke with patient she states she has had some improvement tin her Sx . She states she will call to set up appt if she develops any abdominal pain or flank pain or Sx are still present after completing her antibiotic. Reviewed urine culture results. Patient verbalized understanding.

## 2014-12-14 NOTE — Telephone Encounter (Signed)
Please call patient, her urine culture did not show signs of infection. She did have a small amount of blood in her collection, which could be consistent with either kidney stone or bladder irritation, considering she was having urinary symptoms. If her symptoms have not improved, or she is worsening we would want to rule out other possible causes of her symptoms. Please have her follow-up if necessary.

## 2015-01-03 ENCOUNTER — Ambulatory Visit (INDEPENDENT_AMBULATORY_CARE_PROVIDER_SITE_OTHER): Payer: Federal, State, Local not specified - PPO | Admitting: Family Medicine

## 2015-01-03 ENCOUNTER — Encounter: Payer: Self-pay | Admitting: Family Medicine

## 2015-01-03 VITALS — BP 133/80 | HR 64 | Temp 97.5°F | Resp 16 | Ht 67.0 in | Wt 153.0 lb

## 2015-01-03 DIAGNOSIS — Z23 Encounter for immunization: Secondary | ICD-10-CM | POA: Diagnosis not present

## 2015-01-03 DIAGNOSIS — J302 Other seasonal allergic rhinitis: Secondary | ICD-10-CM | POA: Diagnosis not present

## 2015-01-03 DIAGNOSIS — I10 Essential (primary) hypertension: Secondary | ICD-10-CM

## 2015-01-03 DIAGNOSIS — D62 Acute posthemorrhagic anemia: Secondary | ICD-10-CM

## 2015-01-03 DIAGNOSIS — G2581 Restless legs syndrome: Secondary | ICD-10-CM | POA: Diagnosis not present

## 2015-01-03 LAB — CBC WITH DIFFERENTIAL/PLATELET
BASOS PCT: 0.4 % (ref 0.0–3.0)
Basophils Absolute: 0 10*3/uL (ref 0.0–0.1)
EOS ABS: 0.1 10*3/uL (ref 0.0–0.7)
Eosinophils Relative: 2 % (ref 0.0–5.0)
HEMATOCRIT: 42.2 % (ref 36.0–46.0)
Hemoglobin: 13.8 g/dL (ref 12.0–15.0)
LYMPHS ABS: 1.6 10*3/uL (ref 0.7–4.0)
LYMPHS PCT: 25.4 % (ref 12.0–46.0)
MCHC: 32.6 g/dL (ref 30.0–36.0)
MCV: 89.3 fl (ref 78.0–100.0)
MONOS PCT: 6 % (ref 3.0–12.0)
Monocytes Absolute: 0.4 10*3/uL (ref 0.1–1.0)
NEUTROS ABS: 4.2 10*3/uL (ref 1.4–7.7)
NEUTROS PCT: 66.2 % (ref 43.0–77.0)
PLATELETS: 211 10*3/uL (ref 150.0–400.0)
RBC: 4.73 Mil/uL (ref 3.87–5.11)
RDW: 13.4 % (ref 11.5–15.5)
WBC: 6.4 10*3/uL (ref 4.0–10.5)

## 2015-01-03 LAB — BASIC METABOLIC PANEL
BUN: 18 mg/dL (ref 6–23)
CO2: 28 mEq/L (ref 19–32)
CREATININE: 0.81 mg/dL (ref 0.40–1.20)
Calcium: 9.8 mg/dL (ref 8.4–10.5)
Chloride: 104 mEq/L (ref 96–112)
GFR: 75.57 mL/min (ref >60.00)
Glucose, Bld: 92 mg/dL (ref 70–99)
Potassium: 5.1 mEq/L (ref 3.5–5.1)
Sodium: 140 mEq/L (ref 135–145)

## 2015-01-03 NOTE — Progress Notes (Signed)
OFFICE VISIT  01/03/2015   CC:  Chief Complaint  Patient presents with  . Follow-up    Pt is not fasting.    HPI:    Patient is a 64 y.o. Caucasian female who presents for f/u HTN, GERD with hx of chronic gastritis, osteoarthritis. She is s/p Left TKA 08/30/14.  She is in the home rehab phase.  Pt is set to get her next pap smear next week with her GYN.  No home bp monitoring.  Compliant with med.  Denies dizziness, HA, palpitations.  RLS: takes night time dose all the time, sometimes skips daytime dose depending on how she feels.    GER/gastritis: no longer taking her PPI b/c no longer taking NSAIDs.  Feeling fine from stomach/reflux standpoint off med.  Has intermittent bothersome nasal congestion, sometimes feels like she is swallowing mucous "all night".  Stopped claritin b/c it wasn't helping.  Sometimes uses nasacort.  Past Medical History  Diagnosis Date  . Osteoarthritis of both knees     "bone on bone" per pt--got TKA on left 09/01/14 (Dr. Mardelle Matte)  . GERD (gastroesophageal reflux disease)   . Seasonal allergic rhinitis   . Hypertension   . Urge incontinence   . Restless legs syndrome   . Herpes zoster 1976  . Migraine syndrome     Tylenol x 2 onset, then tylenol #4 if no help, then triptan if no help.  Marland Kitchen Alopecia   . Varicose veins   . History of vertigo   . Palpitations     Echo/Holter monitoring; cardiology eval 2015 all normal.  . Recurrent herpes labialis   . Dry eyes   . Gastritis EGD 07/2013    H pylori neg (mild chronic gastritis)  . IBS (irritable bowel syndrome)     Constipation-predominant  . Primary localized osteoarthritis of left knee 08/30/2014    Left TKA 08/30/14 (Dr. Mardelle Matte)    Past Surgical History  Procedure Laterality Date  . Robotic assisted laparoscopic sacrocolpopexy  05/2013  . Appendectomy  1983  . Tonsillectomy and adenoidectomy  1966  . Abdominal hysterectomy  1980s    endometriosis  . Ovaries and tubes removed  1980s    a few  years after uterus removed.  . Bladder tack  2010    vaginally, with mesh  . Colonoscopy  1989, 1992; 1997; q 5 yrs    Most recent 07/2010, hyperplastic polyp---recommended repeat 5-7 yrs  . Esophagogastroduodenoscopy  07/2013    gastritis; prilosec started  . Total knee arthroplasty Left 08/30/2014    Procedure: TOTAL KNEE ARTHROPLASTY;  Surgeon: Marchia Bond, MD;  Location: [facility];  Service: Orthopedics;  Laterality: Left;    Outpatient Prescriptions Prior to Visit  Medication Sig Dispense Refill  . aspirin 81 MG tablet Take 81 mg by mouth daily.    . Biotin 5000 MCG CAPS Take 5,000 mcg by mouth daily.     . Calcium Carbonate-Vitamin D (CALCIUM + D PO) Take 1 tablet by mouth 2 (two) times daily.     . famciclovir (FAMVIR) 125 MG tablet Take 50 mg by mouth daily as needed.     . loratadine (CLARITIN) 10 MG tablet Take 10 mg by mouth daily.    Marland Kitchen losartan (COZAAR) 100 MG tablet Take 1 tablet (100 mg total) by mouth daily. 90 tablet 3  . meclizine (ANTIVERT) 25 MG tablet Take 25 mg by mouth as needed for dizziness.    . Multiple Vitamin (MULTIVITAMIN) tablet Take 1 tablet by mouth  daily.    . oxybutynin (DITROPAN-XL) 10 MG 24 hr tablet Take 1 tablet (10 mg total) by mouth at bedtime. 90 tablet 3  . Polyethyl Glycol-Propyl Glycol 0.4-0.3 % SOLN Place 1 drop into both eyes 3 (three) times daily as needed (dry eyes). 1 Drop Each Eye 2 To 3 Times A Day    . rOPINIRole (REQUIP) 1 MG tablet Take 1 tablet (1 mg total) by mouth 2 (two) times daily. 1 tab am 1 tab pm 120 tablet 2  . SUMAtriptan (IMITREX) 100 MG tablet Take 100 mg by mouth as needed for migraine or headache. May repeat in 2 hours if headache persists or recurs.    . triamcinolone (NASACORT ALLERGY 24HR) 55 MCG/ACT AERO nasal inhaler Place 2 sprays into the nose daily.    . vitamin E 400 UNIT capsule Take 400 Units by mouth daily.    . nitrofurantoin, macrocrystal-monohydrate, (MACROBID) 100 MG capsule Take 1 capsule (100 mg total) by  mouth 2 (two) times daily. (Patient not taking: Reported on 01/03/2015) 14 capsule 0  . omeprazole (PRILOSEC) 40 MG capsule Take 40 mg by mouth daily.    . ondansetron (ZOFRAN) 4 MG tablet Take 1 tablet (4 mg total) by mouth every 8 (eight) hours as needed for nausea or vomiting. (Patient not taking: Reported on 01/03/2015) 30 tablet 0  . phenazopyridine (PYRIDIUM) 100 MG tablet Take 1 tablet (100 mg total) by mouth 3 (three) times daily as needed for pain. (Patient not taking: Reported on 01/03/2015) 10 tablet 0   No facility-administered medications prior to visit.    Allergies  Allergen Reactions  . Amitiza [Lubiprostone] Hives and Shortness Of Breath  . Biaxin [Clarithromycin] Hives and Shortness Of Breath  . Iodine Swelling    Mouth and throat swelling  . Penicillins Hives and Shortness Of Breath  . Sulfa Antibiotics Hives and Shortness Of Breath  . Versed [Midazolam]   . Zithromax [Azithromycin] Hives and Shortness Of Breath  . Brimonidine Tartrate Itching and Swelling  . Lisinopril     Hair loss  . Adhesive [Tape] Rash  . Cefdinir Itching and Rash  . Levaquin [Levofloxacin In D5w] Itching and Rash    ROS As per HPI  PE: Blood pressure 133/80, pulse 64, temperature 97.5 F (36.4 C), temperature source Oral, resp. rate 16, height 5' 7"  (1.702 m), weight 153 lb (69.4 kg), SpO2 98 %. Gen: Alert, well appearing.  Patient is oriented to person, place, time, and situation. ENT: Ears: EACs clear, normal epithelium.  TMs with good light reflex and landmarks bilaterally.  Eyes: no injection, icteris, swelling, or exudate.  EOMI, PERRLA. Nose: no drainage or turbinate edema/swelling.  No injection or focal lesion.  Mouth: lips without lesion/swelling.  Oral mucosa pink and moist.  Dentition intact and without obvious caries or gingival swelling.  Oropharynx without erythema, exudate, or swelling.  CV: RRR, no m/r/g.   LUNGS: CTA bilat, nonlabored resps, good aeration in all lung  fields. EXT: no clubbing, cyanosis, or edema.    LABS:    Chemistry      Component Value Date/Time   NA 139 08/31/2014 0418   K 4.0 08/31/2014 0418   CL 105 08/31/2014 0418   CO2 29 08/31/2014 0418   BUN 9 08/31/2014 0418   CREATININE 0.75 08/31/2014 0418   CREATININE 0.82 04/13/2014 1107      Component Value Date/Time   CALCIUM 8.4* 08/31/2014 0418   ALKPHOS 62 07/20/2014 1059   AST 20 07/20/2014  1059   ALT 15 07/20/2014 1059   BILITOT 0.5 07/20/2014 1059     Lab Results  Component Value Date   WBC 13.5* 09/01/2014   HGB 11.4* 09/01/2014   HCT 35.5* 09/01/2014   MCV 90.6 09/01/2014   PLT 159 09/01/2014   Lab Results  Component Value Date   CHOL 184 07/20/2014   HDL 63.60 07/20/2014   LDLCALC 93 07/20/2014   TRIG 137.0 07/20/2014   CHOLHDL 3 07/20/2014   Lab Results  Component Value Date   TSH 2.71 07/20/2014   Lab Results  Component Value Date   HGBA1C 5.4 04/13/2014     IMPRESSION AND PLAN:  1) HTN; The current medical regimen is effective;  continue present plan and medications. Lytes/cr today.  2) Post-op anemia from blood loss (knee surg this summer): mild. Recheck CBC today. She has not been taking any iron supp b/c this constipates her.  3) RLS: The current medical regimen is effective;  continue present plan and medications.  4) Allergic rhinitis w/PND: change to different nonsedating antihistamine daily like zyrtec or allegra.  Avoid decongestants due to risk of HTN.  May have to resort to daily use of nasacort that she already has at home.  5) GYN: she is set to see the GYN specialist she requested when I first met her: the [facility] specialist via [facility] named Dr. Juliann Pulse Mathews--next week for first visit.  She is concerned about pelvic floor issues.  An After Visit Summary was printed and given to the patient.  FOLLOW UP: Return in about 6 months (around 07/03/2015) for routine chronic illness f/u (30 min).

## 2015-01-03 NOTE — Progress Notes (Signed)
Pre visit review using our clinic review tool, if applicable. No additional management support is needed unless otherwise documented below in the visit note. 

## 2015-01-11 ENCOUNTER — Encounter: Payer: Self-pay | Admitting: Family Medicine

## 2015-01-26 ENCOUNTER — Other Ambulatory Visit: Payer: Self-pay | Admitting: *Deleted

## 2015-01-26 MED ORDER — ROPINIROLE HCL 1 MG PO TABS: 1.0000 mg | ORAL_TABLET | Freq: Two times a day (BID) | ORAL | Status: DC

## 2015-01-26 NOTE — Telephone Encounter (Addendum)
RF request for famvir LOV: 12/24/14 Next ov: 07/05/15 Last written: Unknown  Please advise. Thanks.    Rx for ropinirole sent.   RF request for ropinirole LOV: 12/24/14 Next ov: 07/05/15 Last written: 08/23/14 #120 w/ 2RF

## 2015-01-27 ENCOUNTER — Other Ambulatory Visit: Payer: Self-pay | Admitting: Family Medicine

## 2015-01-27 MED ORDER — FAMCICLOVIR 500 MG PO TABS: ORAL_TABLET | ORAL | Status: DC

## 2015-01-27 NOTE — Telephone Encounter (Signed)
OK, famvir 500mg , 1 tab tid x 10d, #30, RF x 3 sent in.

## 2015-01-27 NOTE — Telephone Encounter (Signed)
Per Dr. Milinda CaveMcGowen please find out if pt is taking this for herpes labialis or genital herpes, also need to know how pt is taking this medication.   I spoke to to and she stated that when she has a cold sore come up she takes (3) three 500mg  tablets daily x 10 days.

## 2015-03-21 ENCOUNTER — Encounter: Payer: Self-pay | Admitting: Family Medicine

## 2015-03-21 ENCOUNTER — Ambulatory Visit (INDEPENDENT_AMBULATORY_CARE_PROVIDER_SITE_OTHER): Payer: Federal, State, Local not specified - PPO | Admitting: Family Medicine

## 2015-03-21 VITALS — BP 125/70 | HR 72 | Temp 97.6°F | Resp 16 | Ht 67.0 in | Wt 163.0 lb

## 2015-03-21 DIAGNOSIS — K589 Irritable bowel syndrome without diarrhea: Secondary | ICD-10-CM

## 2015-03-21 DIAGNOSIS — K2970 Gastritis, unspecified, without bleeding: Secondary | ICD-10-CM | POA: Diagnosis not present

## 2015-03-21 DIAGNOSIS — K219 Gastro-esophageal reflux disease without esophagitis: Secondary | ICD-10-CM

## 2015-03-21 DIAGNOSIS — Z86010 Personal history of colonic polyps: Secondary | ICD-10-CM

## 2015-03-21 MED ORDER — OMEPRAZOLE 40 MG PO CPDR: 40.0000 mg | DELAYED_RELEASE_CAPSULE | Freq: Every day | ORAL | Status: DC

## 2015-03-21 NOTE — Progress Notes (Signed)
Pre visit review using our clinic review tool, if applicable. No additional management support is needed unless otherwise documented below in the visit note. 

## 2015-03-21 NOTE — Progress Notes (Signed)
OFFICE VISIT  03/21/2015   CC:  Chief Complaint  Patient presents with  . Abdominal Pain    x 1 month  . Cyst    on chest   HPI:    Patient is a 65 y.o. Caucasian female who presents for about 1 mo hx of intermittent epigastric pain, worse after eating, associated with increased belching and sensation of GERD and bloating.  Eating high fiber oatmeal, prune juice, and is taking citrucel qhs with increased water.  Usually has a BM qd, sometimes has difficult evacuation and sometimes not. She doesn't assoc her pain with a change in any of her meds recently.  Oxybutynin increase by her urologist lately has helped her urge incontinence.   She stopped taking her prilosec about 6 wks ago b/c she ran out.  She is taking alleve lately for her knee: 2 wks course. She is trying to watch her diet regarding GER, sleeps with head of bed elevated. Denies any dysphagia.  No n/v.  No melena or hematochezia.  Pt wants me to look at spot on her skin on chest wall she has noted for a long time.  No pain or itching.  Past Medical History  Diagnosis Date  . Osteoarthritis of both knees     "bone on bone" per pt--got TKA on left 09/01/14 (Dr. Dion Saucier)  . GERD (gastroesophageal reflux disease)   . Seasonal allergic rhinitis   . Hypertension   . OAB (overactive bladder)     Saw specialist, Dr. Ashley Royalty, 12/2014 and had her oxybutinyn increased and will start biofeedback.  . Restless legs syndrome   . Herpes zoster 1976  . Migraine syndrome     Tylenol x 2 onset, then tylenol #4 if no help, then triptan if no help.  Marland Kitchen Alopecia   . Varicose veins   . History of vertigo   . Palpitations     Echo/Holter monitoring; cardiology eval 2015 all normal.  . Recurrent herpes labialis   . Dry eyes   . Gastritis EGD 07/2013    H pylori neg (mild chronic gastritis)  . IBS (irritable bowel syndrome)     Constipation-predominant  . Primary localized osteoarthritis of left knee 08/30/2014    Left TKA 08/30/14 (Dr.  Dion Saucier)  . History of endometriosis   . Bright red rectal bleeding     Painful; anal fissure suspected (although not seen on exam by her GYN specialist, Dr. Ashley Royalty).    Past Surgical History  Procedure Laterality Date  . Robotic assisted laparoscopic sacrocolpopexy  05/2013  . Appendectomy  1983  . Tonsillectomy and adenoidectomy  1966  . Abdominal hysterectomy  1980s    endometriosis  . Ovaries and tubes removed  1980s    a few years after uterus removed.  . Bladder tack  2010    vaginally, with mesh  . Colonoscopy  1989, 1992; 1997; q 5 yrs    Most recent 07/2010, hyperplastic polyp---recommended repeat 5-7 yrs  . Esophagogastroduodenoscopy  07/2013    gastritis; prilosec started  . Total knee arthroplasty Left 08/30/2014    Procedure: TOTAL KNEE ARTHROPLASTY;  Surgeon: Teryl Lucy, MD;  Location: [facility];  Service: Orthopedics;  Laterality: Left;  . Cholecystectomy, laparoscopic  2014    Outpatient Prescriptions Prior to Visit  Medication Sig Dispense Refill  . acetaminophen-codeine (TYLENOL #4) 300-60 MG tablet Take 1 tablet by mouth every 4 (four) hours as needed for moderate pain.    Marland Kitchen aspirin 81 MG tablet Take 81 mg  by mouth daily.    . Biotin 5000 MCG CAPS Take 5,000 mcg by mouth daily.     . Calcium Carbonate-Vitamin D (CALCIUM + D PO) Take 1 tablet by mouth 2 (two) times daily.     . famciclovir (FAMVIR) 500 MG tablet 1 tab tid x 10d 30 tablet 3  . losartan (COZAAR) 100 MG tablet Take 1 tablet (100 mg total) by mouth daily. 90 tablet 3  . meclizine (ANTIVERT) 25 MG tablet Take 25 mg by mouth as needed for dizziness.    . Multiple Vitamin (MULTIVITAMIN) tablet Take 1 tablet by mouth daily.    . naproxen sodium (ANAPROX) 220 MG tablet Take 220 mg by mouth 2 (two) times daily with a meal.    . Polyethyl Glycol-Propyl Glycol 0.4-0.3 % SOLN Place 1 drop into both eyes 3 (three) times daily as needed (dry eyes). 1 Drop Each Eye 2 To 3 Times A Day    . rOPINIRole (REQUIP) 1 MG  tablet Take 1 tablet (1 mg total) by mouth 2 (two) times daily. 1 tab am 1 tab pm 120 tablet 2  . SUMAtriptan (IMITREX) 100 MG tablet Take 100 mg by mouth as needed for migraine or headache. May repeat in 2 hours if headache persists or recurs.    . triamcinolone (NASACORT ALLERGY 24HR) 55 MCG/ACT AERO nasal inhaler Place 2 sprays into the nose daily.    . vitamin E 400 UNIT capsule Take 400 Units by mouth daily.    Marland Kitchen loratadine (CLARITIN) 10 MG tablet Take 10 mg by mouth daily. Reported on 03/21/2015    . oxybutynin (DITROPAN-XL) 10 MG 24 hr tablet Take 1 tablet (10 mg total) by mouth at bedtime. (Patient not taking: Reported on 03/21/2015) 90 tablet 3   No facility-administered medications prior to visit.    Allergies  Allergen Reactions  . Amitiza [Lubiprostone] Hives and Shortness Of Breath  . Biaxin [Clarithromycin] Hives and Shortness Of Breath  . Iodine Swelling    Mouth and throat swelling  . Penicillins Hives and Shortness Of Breath  . Sulfa Antibiotics Hives and Shortness Of Breath  . Versed [Midazolam]   . Zithromax [Azithromycin] Hives and Shortness Of Breath  . Brimonidine Tartrate Itching and Swelling  . Lisinopril     Hair loss  . Adhesive [Tape] Rash  . Cefdinir Itching and Rash  . Levaquin [Levofloxacin In D5w] Itching and Rash    ROS As per HPI  PE: Blood pressure 125/70, pulse 72, temperature 97.6 F (36.4 C), temperature source Oral, resp. rate 16, height  (1.702 m), weight 163 lb (73.936 kg), SpO2 98 %.  Pt examined with Wallace Keller, CMA, as chaperone.  Gen: Alert, well appearing.  Patient is oriented to person, place, time, and situation. YNW:GNFA: no injection, icteris, swelling, or exudate.  EOMI, PERRLA. Mouth: lips without lesion/swelling.  Oral mucosa pink and moist. Oropharynx without erythema, exudate, or swelling.  CV: RRR, no m/r/g.   LUNGS: CTA bilat, nonlabored resps, good aeration in all lung fields. ABD: soft, nondistended, BS normal.   NO bruit, HSM, or mass.  Mild TTP in mid epigastric abd region.  No guarding or rebound. Chest wall: just to the left of the sternum she has a 2 mm subcutaneous rubbery-feeling nodule c/w an epidermal inclusion cyst.  No erythema or tenderness.  LABS:  None today  IMPRESSION AND PLAN:  1) Acute dyspepsia/gastritis, with GERD.  She seems to have had this flare up lately after getting off  of her chronic PPI abruptly when she ran out of this med.   Continue GER diet/reflux precautions. Restart omeprazole  qAM, also start two week course of otc zantac  qhs.  2) IBS: constipation predominant.   She is on a pretty good dietary regimen right now that seems to be helping this, although some of her bloating symptom lately could be related to this disorder.  3) Hx of colon polyps: as per old records, her last colonoscopy was 05/2010 and it was recommended that she get repeat colonoscopy in 5-7 yrs.  Pt wants local GI referral eventually (after her medicare starts --after 08/14/15) to pick up where prior GI left off.  An After Visit Summary was printed and given to the patient.  FOLLOW UP: Return if symptoms worsen or fail to improve.

## 2015-03-21 NOTE — Patient Instructions (Signed)
Buy otc generic zantac  and take 1 tab at bedtime x 2 weeks.

## 2015-03-26 ENCOUNTER — Other Ambulatory Visit: Payer: Self-pay | Admitting: Family Medicine

## 2015-03-27 NOTE — Telephone Encounter (Signed)
RF request for losartan LOV: 01/03/15 Next ov: 07/05/15 Last written: 02/23/14 #90 w/ 3RF

## 2015-05-10 ENCOUNTER — Other Ambulatory Visit: Payer: Self-pay

## 2015-05-10 DIAGNOSIS — Z1231 Encounter for screening mammogram for malignant neoplasm of breast: Secondary | ICD-10-CM

## 2015-06-15 ENCOUNTER — Ambulatory Visit
Admission: RE | Admit: 2015-06-15 | Discharge: 2015-06-15 | Disposition: A | Discharge status: Home / Self Care | Payer: Federal, State, Local not specified - PPO | Source: Ambulatory Visit | Attending: Family Medicine | Admitting: Family Medicine

## 2015-06-15 ENCOUNTER — Other Ambulatory Visit: Payer: Self-pay

## 2015-06-15 ENCOUNTER — Ambulatory Visit
Admission: RE | Admit: 2015-06-15 | Discharge: 2015-06-15 | Disposition: A | Discharge status: Home / Self Care | Payer: Federal, State, Local not specified - PPO | Source: Ambulatory Visit

## 2015-06-15 ENCOUNTER — Other Ambulatory Visit: Payer: Self-pay | Admitting: Family Medicine

## 2015-06-15 DIAGNOSIS — N63 Unspecified lump in unspecified breast: Secondary | ICD-10-CM

## 2015-06-15 DIAGNOSIS — Z1231 Encounter for screening mammogram for malignant neoplasm of breast: Secondary | ICD-10-CM

## 2015-07-05 ENCOUNTER — Ambulatory Visit (INDEPENDENT_AMBULATORY_CARE_PROVIDER_SITE_OTHER): Payer: Federal, State, Local not specified - PPO | Admitting: Family Medicine

## 2015-07-05 ENCOUNTER — Encounter: Payer: Self-pay | Admitting: Family Medicine

## 2015-07-05 VITALS — BP 117/71 | HR 63 | Temp 97.4°F | Resp 16 | Ht 67.0 in | Wt 165.5 lb

## 2015-07-05 DIAGNOSIS — I10 Essential (primary) hypertension: Secondary | ICD-10-CM

## 2015-07-05 DIAGNOSIS — Z8719 Personal history of other diseases of the digestive system: Secondary | ICD-10-CM

## 2015-07-05 DIAGNOSIS — G2581 Restless legs syndrome: Secondary | ICD-10-CM | POA: Diagnosis not present

## 2015-07-05 DIAGNOSIS — K219 Gastro-esophageal reflux disease without esophagitis: Secondary | ICD-10-CM | POA: Diagnosis not present

## 2015-07-05 LAB — COMPREHENSIVE METABOLIC PANEL
ALBUMIN: 4.3 g/dL (ref 3.5–5.2)
ALT: 18 U/L (ref 0–35)
AST: 19 U/L (ref 0–37)
Alkaline Phosphatase: 76 U/L (ref 39–117)
BUN: 17 mg/dL (ref 6–23)
CHLORIDE: 105 meq/L (ref 96–112)
CO2: 28 mEq/L (ref 19–32)
Calcium: 9.5 mg/dL (ref 8.4–10.5)
Creatinine, Ser: 0.78 mg/dL (ref 0.40–1.20)
GFR: 78.81 mL/min (ref >60.00)
GLUCOSE: 85 mg/dL (ref 70–99)
POTASSIUM: 4.2 meq/L (ref 3.5–5.1)
SODIUM: 140 meq/L (ref 135–145)
Total Bilirubin: 0.6 mg/dL (ref 0.2–1.2)
Total Protein: 6.6 g/dL (ref 6.0–8.3)

## 2015-07-05 LAB — LIPID PANEL
CHOLESTEROL: 218 mg/dL — AB (ref 0–200)
HDL: 53.4 mg/dL (ref >39.00)
LDL CALC: 136 mg/dL — AB (ref 0–99)
NonHDL: 164.2
Total CHOL/HDL Ratio: 4
Triglycerides: 141 mg/dL (ref 0.0–149.0)
VLDL: 28.2 mg/dL (ref 0.0–40.0)

## 2015-07-05 NOTE — Progress Notes (Signed)
Pre visit review using our clinic review tool, if applicable. No additional management support is needed unless otherwise documented below in the visit note. 

## 2015-07-05 NOTE — Progress Notes (Signed)
OFFICE VISIT  07/05/2015   CC:  Chief Complaint  Patient presents with  . Follow-up    Pt is fasting.      HPI:    Patient is a 65 y.o. Caucasian female who presents for 6 mo f/u HTN, GERD w/hx of chronic gastritis, RLS. Monitors bp at home and gets 120s/70s consistently.   GERD/gastritis has been well controlled on prilosec.  She has plans to arrange a repeat colonoscopy and EGD in a few months.  RLS: takes a requip in afternoon and another before bed and this is very helpful.  Recent mammography/breast u/s showed sebacious cyst.  She will be seeking out a dermatologist soon to get this removed.  ROS: some intermittent periumbillical pain associated with stress and her constipation-predominant IBS.  No n/v/d. No fevers.  No HA, dizziness, CP, or SOB.   No LE swelling.  Past Medical History  Diagnosis Date  . Osteoarthritis of both knees     "bone on bone" per pt--got TKA on left 09/01/14 (Dr. Dion Saucier)  . GERD (gastroesophageal reflux disease)   . Seasonal allergic rhinitis   . Hypertension   . OAB (overactive bladder)     Saw specialist, Dr. Ashley Royalty, 12/2014 and had her oxybutinyn increased and will start biofeedback.  . Restless legs syndrome   . Herpes zoster 1976  . Migraine syndrome     Tylenol x 2 onset, then tylenol #4 if no help, then triptan if no help.  Marland Kitchen Alopecia   . Varicose veins   . History of vertigo   . Palpitations     Echo/Holter monitoring; cardiology eval 2015 all normal.  . Recurrent herpes labialis   . Dry eyes   . Gastritis EGD 07/2013    H pylori neg (mild chronic gastritis)  . IBS (irritable bowel syndrome)     Constipation-predominant  . Primary localized osteoarthritis of left knee 08/30/2014    Left TKA 08/30/14 (Dr. Dion Saucier)  . History of endometriosis   . Bright red rectal bleeding     Painful; anal fissure suspected (although not seen on exam by her GYN specialist, Dr. Ashley Royalty).    Past Surgical History  Procedure Laterality Date  .  Robotic assisted laparoscopic sacrocolpopexy  05/2013  . Appendectomy  1983  . Tonsillectomy and adenoidectomy  1966  . Abdominal hysterectomy  1980s    endometriosis  . Ovaries and tubes removed  1980s    a few years after uterus removed.  . Bladder tack  2010    vaginally, with mesh  . Colonoscopy  1989, 1992; 1997; q 5 yrs    Most recent 07/2010, hyperplastic polyp---recommended repeat 5-7 yrs  . Esophagogastroduodenoscopy  07/2013    gastritis; prilosec started  . Total knee arthroplasty Left 08/30/2014    Procedure: TOTAL KNEE ARTHROPLASTY;  Surgeon: Teryl Lucy, MD;  Location: [facility];  Service: Orthopedics;  Laterality: Left;  . Cholecystectomy, laparoscopic  2014    Outpatient Prescriptions Prior to Visit  Medication Sig Dispense Refill  . acetaminophen-codeine (TYLENOL #4) 300-60 MG tablet Take 1 tablet by mouth every 4 (four) hours as needed for moderate pain.    Marland Kitchen aspirin 81 MG tablet Take 81 mg by mouth daily.    . Biotin 5000 MCG CAPS Take 5,000 mcg by mouth daily.     . Calcium Carbonate-Vitamin D (CALCIUM + D PO) Take 1 tablet by mouth 2 (two) times daily.     . cetirizine (ZYRTEC) 10 MG tablet Take 10 mg by  mouth daily.    . famciclovir (FAMVIR) 500 MG tablet 1 tab tid x 10d 30 tablet 3  . losartan (COZAAR) 100 MG tablet TAKE 1 TABLET BY MOUTH EVERY DAY 90 tablet 3  . meclizine (ANTIVERT) 25 MG tablet Take 25 mg by mouth as needed for dizziness.    . Multiple Vitamin (MULTIVITAMIN) tablet Take 1 tablet by mouth daily.    . naproxen sodium (ANAPROX) 220 MG tablet Take 220 mg by mouth 2 (two) times daily with a meal.    . omeprazole (PRILOSEC) 40 MG capsule Take 1 capsule (40 mg total) by mouth daily. 30 capsule 6  . oxybutynin (DITROPAN XL) 15 MG 24 hr tablet Take 15 mg by mouth daily.    Bertram Gala Glycol-Propyl Glycol 0.4-0.3 % SOLN Place 1 drop into both eyes 3 (three) times daily as needed (dry eyes). 1 Drop Each Eye 2 To 3 Times A Day    . rOPINIRole (REQUIP) 1 MG  tablet Take 1 tablet (1 mg total) by mouth 2 (two) times daily. 1 tab am 1 tab pm 120 tablet 2  . SUMAtriptan (IMITREX) 100 MG tablet Take 100 mg by mouth as needed for migraine or headache. May repeat in 2 hours if headache persists or recurs.    . triamcinolone (NASACORT ALLERGY 24HR) 55 MCG/ACT AERO nasal inhaler Place 2 sprays into the nose daily.    . vitamin E 400 UNIT capsule Take 400 Units by mouth daily.     No facility-administered medications prior to visit.    Allergies  Allergen Reactions  . Amitiza [Lubiprostone] Hives and Shortness Of Breath  . Biaxin [Clarithromycin] Hives and Shortness Of Breath  . Iodine Swelling    Mouth and throat swelling  . Penicillins Hives and Shortness Of Breath  . Sulfa Antibiotics Hives and Shortness Of Breath  . Versed [Midazolam]   . Zithromax [Azithromycin] Hives and Shortness Of Breath  . Brimonidine Tartrate Itching and Swelling  . Lisinopril     Hair loss  . Adhesive [Tape] Rash  . Cefdinir Itching and Rash  . Levaquin [Levofloxacin In D5w] Itching and Rash    ROS As per HPI  PE: Blood pressure 117/71, pulse 63, temperature 97.4 F (36.3 C), temperature source Oral, resp. rate 16, height  (1.702 m), weight 165 lb 8 oz (75.07 kg), SpO2 98 %. Gen: Alert, well appearing.  Patient is oriented to person, place, time, and situation. CV: RRR, no m/r/g.   LUNGS: CTA bilat, nonlabored resps, good aeration in all lung fields. EXT: no clubbing, cyanosis, or edema.    LABS:  Lab Results  Component Value Date   TSH 2.71 07/20/2014   Lab Results  Component Value Date   WBC 6.4 01/03/2015   HGB 13.8 01/03/2015   HCT 42.2 01/03/2015   MCV 89.3 01/03/2015   PLT 211.0 01/03/2015   Lab Results  Component Value Date   CREATININE 0.81 01/03/2015   BUN 18 01/03/2015   NA 140 01/03/2015   K 5.1 01/03/2015   CL 104 01/03/2015   CO2 28 01/03/2015   Lab Results  Component Value Date   ALT 15 07/20/2014   AST 20 07/20/2014    ALKPHOS 62 07/20/2014   BILITOT 0.5 07/20/2014   Lab Results  Component Value Date   CHOL 184 07/20/2014   Lab Results  Component Value Date   HDL 63.60 07/20/2014   Lab Results  Component Value Date   LDLCALC 93 07/20/2014  Lab Results  Component Value Date   TRIG 137.0 07/20/2014   Lab Results  Component Value Date   CHOLHDL 3 07/20/2014   Lab Results  Component Value Date   HGBA1C 5.4 04/13/2014    IMPRESSION AND PLAN:  1) HTN: The current medical regimen is effective;  continue present plan and medications. CMET today. Will also check FLP today.  2) GERD w/hx of chronic gastritis: responding well to daily PPI and trying to minimize NSAIDs.  3) RLS: The current medical regimen is effective;  continue present plan and medications.  4) Sebaceous cyst of left breast (dx'd when radiologist worked up pt's complaint of lump in left breast 05/2015): she will seek out a dermatologist to get this excised.  An After Visit Summary was printed and given to the patient.  FOLLOW UP: Return in about 6 months (around 01/05/2016) for annual CPE (fasting).  Signed:  Santiago BumpersPhil Nicolaas Savo, MD           07/05/2015

## 2015-07-23 ENCOUNTER — Other Ambulatory Visit: Payer: Self-pay | Admitting: Family Medicine

## 2015-07-28 ENCOUNTER — Telehealth: Payer: Self-pay | Admitting: *Deleted

## 2015-07-28 MED ORDER — ROPINIROLE HCL 1 MG PO TABS: ORAL_TABLET | ORAL | Status: DC

## 2015-07-28 NOTE — Telephone Encounter (Signed)
Pt LMOM on 07/27/15 at 11:32am stating that Rx for her Requip was sent to pharmacy for #120 which is only for 50 day supply. She also stated that she normally gets a years worth of refills. I reviewed Rx sent on 07/24/15 and seen that it was sent for #120 w/ 2Rf. I resent Rx for #180 w/ 3RF with instructions for pharmacy to d/c Rx sent on 07/24/15.

## 2015-07-28 NOTE — Telephone Encounter (Signed)
Left detailed message on cell vm, okay per DPR.  

## 2015-08-29 ENCOUNTER — Encounter: Payer: Self-pay | Admitting: Family Medicine

## 2015-08-29 ENCOUNTER — Ambulatory Visit (INDEPENDENT_AMBULATORY_CARE_PROVIDER_SITE_OTHER): Payer: Federal, State, Local not specified - PPO | Admitting: Family Medicine

## 2015-08-29 VITALS — BP 131/79 | HR 71 | Temp 97.9°F | Resp 16 | Ht 67.0 in | Wt 165.0 lb

## 2015-08-29 DIAGNOSIS — M6588 Other synovitis and tenosynovitis, other site: Secondary | ICD-10-CM | POA: Diagnosis not present

## 2015-08-29 DIAGNOSIS — M19042 Primary osteoarthritis, left hand: Secondary | ICD-10-CM

## 2015-08-29 DIAGNOSIS — M19041 Primary osteoarthritis, right hand: Secondary | ICD-10-CM | POA: Diagnosis not present

## 2015-08-29 DIAGNOSIS — M659 Synovitis and tenosynovitis, unspecified: Secondary | ICD-10-CM

## 2015-08-29 DIAGNOSIS — M65949 Unspecified synovitis and tenosynovitis, unspecified hand: Secondary | ICD-10-CM

## 2015-08-29 MED ORDER — MELOXICAM 15 MG PO TABS: 15.0000 mg | ORAL_TABLET | Freq: Every day | ORAL | Status: DC

## 2015-08-29 NOTE — Progress Notes (Signed)
OFFICE VISIT  08/29/2015   CC:  Chief Complaint  Patient presents with  . Hand Pain    both x 7 weeks   HPI:    Patient is a 65 y.o. Caucasian female who presents for bilateral diffuse hand joint pains, trigger thumb on L. Some swelling in all knuckles of hands by the end of the day.  Stiffness in hands in morning.  If lots of activities in day with hands then they are painful and stiff in end of day as well.  [place]ays [place] a hand specialist for hypertrophied/arthritic left 1st carpometacarpal joint, surgery was discussed but she declined at that time. Takes aleve about once a day.   She has appt for f/u knee arthritis with her orthopedist, Dr. Dion SaucierLandau, tomorrow.  Past Medical History  Diagnosis Date  . Osteoarthritis of both knees     "bone on bone" per pt--got TKA on left 09/01/14 (Dr. Dion SaucierLandau)  . GERD (gastroesophageal reflux disease)   . Seasonal allergic rhinitis   . Hypertension   . OAB (overactive bladder)     Saw specialist, Dr. Ashley RoyaltyMatthews, 12/2014 and had her oxybutinyn increased and will start biofeedback.  . Restless legs syndrome   . Herpes zoster 1976  . Migraine syndrome     Tylenol x 2 onset, then tylenol #4 if no help, then triptan if no help.  Crystal Murray. Alopecia   . Varicose veins   . History of vertigo   . Palpitations     Echo/Holter monitoring; cardiology eval 2015 all normal.  . Recurrent herpes labialis   . Dry eyes   . Gastritis EGD 07/2013    H pylori neg (mild chronic gastritis)  . IBS (irritable bowel syndrome)     Constipation-predominant  . Primary localized osteoarthritis of left knee 08/30/2014    Left TKA 08/30/14 (Dr. Dion SaucierLandau)  . History of endometriosis   . Bright red rectal bleeding     Painful; anal fissure suspected (although not seen on exam by her GYN specialist, Dr. Ashley RoyaltyMatthews).    Past Surgical History  Procedure Laterality Date  . Robotic assisted laparoscopic sacrocolpopexy  05/2013  . Appendectomy  1983  .  Tonsillectomy and adenoidectomy  1966  . Abdominal hysterectomy  1980s    endometriosis  . Ovaries and tubes removed  1980s    a few years after uterus removed.  . Bladder tack  2010    vaginally, with mesh  . Colonoscopy  1989, 1992; 1997; q 5 yrs    Most recent 07/2010, hyperplastic polyp---recommended repeat 5-7 yrs  . Esophagogastroduodenoscopy  07/2013    gastritis; prilosec started  . Total knee arthroplasty Left 08/30/2014    Procedure: TOTAL KNEE ARTHROPLASTY;  Surgeon: Teryl LucyJoshua Landau, MD;  Location: [facility] OR;  Service: Orthopedics;  Laterality: Left;  . Cholecystectomy, laparoscopic  2014    Outpatient Prescriptions Prior to Visit  Medication Sig Dispense Refill  . acetaminophen-codeine (TYLENOL #4) 300-60 MG tablet Take 1 tablet by mouth every 4 (four) hours as needed for moderate pain.    Crystal Murray. aspirin 81 MG tablet Take 81 mg by mouth daily.    . Biotin 5000 MCG CAPS Take 5,000 mcg by mouth daily.     . Calcium Carbonate-Vitamin D (CALCIUM + D PO) Take 1 tablet by mouth 2 (two) times daily.     . cetirizine (ZYRTEC) 10 MG tablet Take 10 mg by mouth daily.    Crystal Murray. losartan (COZAAR) 100 MG tablet  TAKE 1 TABLET BY MOUTH EVERY DAY 90 tablet 3  . meclizine (ANTIVERT) 25 MG tablet Take 25 mg by mouth as needed for dizziness.    . Multiple Vitamin (MULTIVITAMIN) tablet Take 1 tablet by mouth daily.    . naproxen sodium (ANAPROX) 220 MG tablet Take 220 mg by mouth 2 (two) times daily with a meal.    . omeprazole (PRILOSEC) 40 MG capsule Take 1 capsule (40 mg total) by mouth daily. 30 capsule 6  . oxybutynin (DITROPAN XL) 15 MG 24 hr tablet Take 15 mg by mouth daily.    Bertram Gala Glycol-Propyl Glycol 0.4-0.3 % SOLN Place 1 drop into both eyes 3 (three) times daily as needed (dry eyes). 1 Drop Each Eye 2 To 3 Times A Day    . rOPINIRole (REQUIP) 1 MG tablet TAKE 1 TABLET BY MOUTH EVERY MORNING AND EVERY EVENING 180 tablet 3  . SUMAtriptan (IMITREX) 100 MG tablet Take 100 mg by mouth as needed for  migraine or headache. May repeat in 2 hours if headache persists or recurs.    . triamcinolone (NASACORT ALLERGY 24HR) 55 MCG/ACT AERO nasal inhaler Place 2 sprays into the nose daily.    . vitamin E 400 UNIT capsule Take 400 Units by mouth daily.    . famciclovir (FAMVIR) 500 MG tablet 1 tab tid x 10d (Patient not taking: Reported on 08/29/2015) 30 tablet 3   No facility-administered medications prior to visit.    Allergies  Allergen Reactions  . Amitiza [Lubiprostone] Hives and Shortness Of Breath  . Biaxin [Clarithromycin] Hives and Shortness Of Breath  . Iodine Swelling    Mouth and throat swelling  . Penicillins Hives and Shortness Of Breath  . Sulfa Antibiotics Hives and Shortness Of Breath  . Versed [Midazolam]   . Zithromax [Azithromycin] Hives and Shortness Of Breath  . Brimonidine Tartrate Itching and Swelling  . Lisinopril     Hair loss  . Adhesive [Tape] Rash  . Cefdinir Itching and Rash  . Levaquin [Levofloxacin In D5w] Itching and Rash    ROS As per HPI  PE: Blood pressure 131/79, pulse 71, temperature 97.9 F (36.6 C), temperature source Oral, resp. rate 16, height  (1.702 m), weight 165 lb (74.844 kg), SpO2 97 %. Gen: Alert, well appearing.  Patient is oriented to person, place, time, and situation. Hands: No abnormal warmth or erythema.  No significant swelling of any joints except she has some bony hypertrophy of her left 1st carpometacarpal joint.  Mild TTP over PIPs and DIPs but otherwise no tenderness. ROM fully intact in all fingers.  Left thumb with small palpable nodularity on flexor tendon near base of proximal phalanx, and it does lock intermittently when pt flexes thumb, and she is able to pull it back into extended position.  LABS:    Chemistry      Component Value Date/Time   NA 140 07/05/2015 1038   K 4.2 07/05/2015 1038   CL 105 07/05/2015 1038   CO2 28 07/05/2015 1038   BUN 17 07/05/2015 1038   CREATININE 0.78 07/05/2015 1038    CREATININE 0.82 04/13/2014 1107      Component Value Date/Time   CALCIUM 9.5 07/05/2015 1038   ALKPHOS 76 07/05/2015 1038   AST 19 07/05/2015 1038   ALT 18 07/05/2015 1038   BILITOT 0.6 07/05/2015 1038       IMPRESSION AND PLAN:  Bilateral hand osteoarthritis, with more significant dz apparent in 1st carpo metacarpal joint  on L hand, plus she has L thumb flexor tenosynovitis. Discussed starting once daily mobic 15mg  with food, may take tylenol bid prn as well.  Therapeutic expectations and side effect profile of medication discussed today.  Patient's questions answered.  Discussed getting another consult with hand specialist regarding both her carpometacarpal joint deformity and her flexor tenosynovitis of L thumb.  She will ask her current orthopedist if their office has a hand specialist and she'll get back to me and I'll order the appropriate referral.  An After Visit Summary was printed and given to the patient.  FOLLOW UP: Return if symptoms worsen or fail to improve.  Signed:  Santiago Bumpers, MD           08/29/2015

## 2015-08-29 NOTE — Progress Notes (Signed)
Pre visit review using our clinic review tool, if applicable. No additional management support is needed unless otherwise documented below in the visit note. 

## 2015-09-06 ENCOUNTER — Telehealth: Payer: Self-pay | Admitting: Family Medicine

## 2015-09-06 ENCOUNTER — Encounter: Payer: Self-pay | Admitting: Gastroenterology

## 2015-09-06 DIAGNOSIS — M19041 Primary osteoarthritis, right hand: Secondary | ICD-10-CM

## 2015-09-06 DIAGNOSIS — M659 Synovitis and tenosynovitis, unspecified: Secondary | ICD-10-CM

## 2015-09-06 DIAGNOSIS — M19042 Primary osteoarthritis, left hand: Principal | ICD-10-CM

## 2015-09-06 NOTE — Telephone Encounter (Signed)
Pt requesting referral to hand specialist as discussed with Dr. Marvel PlanMcGowan at office visit last week.

## 2015-09-06 NOTE — Telephone Encounter (Signed)
Ordered referral to [facility]Gramig [facility].

## 2015-09-06 NOTE — Telephone Encounter (Signed)
Pt advised and voiced understanding.   

## 2015-09-06 NOTE — Telephone Encounter (Signed)
Please advise. Thanks.  

## 2015-11-10 ENCOUNTER — Ambulatory Visit: Payer: Federal, State, Local not specified - PPO | Admitting: Gastroenterology

## 2015-12-12 ENCOUNTER — Ambulatory Visit (INDEPENDENT_AMBULATORY_CARE_PROVIDER_SITE_OTHER): Payer: Federal, State, Local not specified - PPO | Admitting: Family Medicine

## 2015-12-12 ENCOUNTER — Encounter: Payer: Self-pay | Admitting: Family Medicine

## 2015-12-12 VITALS — BP 115/72 | HR 70 | Temp 97.8°F | Resp 16 | Wt 165.0 lb

## 2015-12-12 DIAGNOSIS — H60391 Other infective otitis externa, right ear: Secondary | ICD-10-CM | POA: Diagnosis not present

## 2015-12-12 MED ORDER — CLINDAMYCIN HCL 300 MG PO CAPS: 300.0000 mg | ORAL_CAPSULE | Freq: Three times a day (TID) | ORAL | 0 refills | Status: DC

## 2015-12-12 NOTE — Progress Notes (Signed)
Pre visit review using our clinic review tool, if applicable. No additional management support is needed unless otherwise documented below in the visit note. 

## 2015-12-12 NOTE — Progress Notes (Signed)
OFFICE VISIT  12/12/2015   CC:  Chief Complaint  Patient presents with  . Abrasion    right ear lobe red, crusty and swollen   HPI:    Patient is a 65 y.o. Caucasian female who presents for about 2 mo of R ear lobe pain and swelling.   Sx's waxed and waned some and she applies neosporin and alcohol, works on good hygiene in the area and has not worn an ear ring in this ear in 2 mo.  No itching but lots of stinging and burning.  No recent NEW ear rings.  Past Medical History:  Diagnosis Date  . Alopecia   . Bright red rectal bleeding    Painful; anal fissure suspected (although not seen on exam by her GYN specialist, Dr. Ashley RoyaltyMatthews).  . Dry eyes   . Gastritis EGD 07/2013   H pylori neg (mild chronic gastritis)  . GERD (gastroesophageal reflux disease)   . Herpes zoster 1976  . History of endometriosis   . History of vertigo   . Hypertension   . IBS (irritable bowel syndrome)    Constipation-predominant  . Migraine syndrome    Tylenol x 2 onset, then tylenol #4 if no help, then triptan if no help.  Marland Kitchen. OAB (overactive bladder)    Saw specialist, Dr. Ashley RoyaltyMatthews, 12/2014 and had her oxybutinyn increased and will start biofeedback.  . Osteoarthritis of both knees    "bone on bone" per pt--got TKA on left 09/01/14 (Dr. Dion SaucierLandau)  . Palpitations    Echo/Holter monitoring; cardiology eval 2015 all normal.  . Primary localized osteoarthritis of left knee 08/30/2014   Left TKA 08/30/14 (Dr. Dion SaucierLandau)  . Recurrent herpes labialis   . Restless legs syndrome   . Seasonal allergic rhinitis   . Varicose veins     Past Surgical History:  Procedure Laterality Date  . ABDOMINAL HYSTERECTOMY  1980s   endometriosis  . APPENDECTOMY  1983  . bladder tack  2010   vaginally, with mesh  . CHOLECYSTECTOMY, LAPAROSCOPIC  2014  . COLONOSCOPY  1989, 1992; 1997; q 5 yrs   Most recent 07/2010, hyperplastic polyp---recommended repeat 5-7 yrs  . ESOPHAGOGASTRODUODENOSCOPY  07/2013   gastritis; prilosec  started  . Ovaries and tubes removed  1980s   a few years after uterus removed.  . ROBOTIC ASSISTED LAPAROSCOPIC SACROCOLPOPEXY  05/2013  . TONSILLECTOMY AND ADENOIDECTOMY  1966  . TOTAL KNEE ARTHROPLASTY Left 08/30/2014   Procedure: TOTAL KNEE ARTHROPLASTY;  Surgeon: Teryl LucyJoshua Landau, MD;  Location: [facility] OR;  Service: Orthopedics;  Laterality: Left;    Outpatient Medications Prior to Visit  Medication Sig Dispense Refill  . acetaminophen-codeine (TYLENOL #4) 300-60 MG tablet Take 1 tablet by mouth every 4 (four) hours as needed for moderate pain.    Marland Kitchen. aspirin 81 MG tablet Take 81 mg by mouth daily.    . Biotin 5000 MCG CAPS Take 5,000 mcg by mouth daily.     . Calcium Carbonate-Vitamin D (CALCIUM + D PO) Take 1 tablet by mouth 2 (two) times daily.     . cetirizine (ZYRTEC) 10 MG tablet Take 10 mg by mouth daily.    Marland Kitchen. losartan (COZAAR) 100 MG tablet TAKE 1 TABLET BY MOUTH EVERY DAY 90 tablet 3  . meclizine (ANTIVERT) 25 MG tablet Take 25 mg by mouth as needed for dizziness.    . meloxicam (MOBIC) 15 MG tablet Take 1 tablet (15 mg total) by mouth daily. 30 tablet 3  . Multiple Vitamin (MULTIVITAMIN)  tablet Take 1 tablet by mouth daily.    . naproxen sodium (ANAPROX) 220 MG tablet Take 220 mg by mouth 2 (two) times daily with a meal.    . omeprazole (PRILOSEC) 40 MG capsule Take 1 capsule (40 mg total) by mouth daily. 30 capsule 6  . oxybutynin (DITROPAN XL) 15 MG 24 hr tablet Take 15 mg by mouth daily.    Bertram Gala Glycol-Propyl Glycol 0.4-0.3 % SOLN Place 1 drop into both eyes 3 (three) times daily as needed (dry eyes). 1 Drop Each Eye 2 To 3 Times A Day    . rOPINIRole (REQUIP) 1 MG tablet TAKE 1 TABLET BY MOUTH EVERY MORNING AND EVERY EVENING 180 tablet 3  . SUMAtriptan (IMITREX) 100 MG tablet Take 100 mg by mouth as needed for migraine or headache. May repeat in 2 hours if headache persists or recurs.    . triamcinolone (NASACORT ALLERGY 24HR) 55 MCG/ACT AERO nasal inhaler Place 2 sprays into  the nose daily.    . vitamin E 400 UNIT capsule Take 400 Units by mouth daily.     No facility-administered medications prior to visit.     Allergies  Allergen Reactions  . Amitiza [Lubiprostone] Hives and Shortness Of Breath  . Biaxin [Clarithromycin] Hives and Shortness Of Breath  . Iodine Swelling    Mouth and throat swelling  . Penicillins Hives and Shortness Of Breath  . Sulfa Antibiotics Hives and Shortness Of Breath  . Versed [Midazolam]   . Zithromax [Azithromycin] Hives and Shortness Of Breath  . Brimonidine Tartrate Itching and Swelling  . Lisinopril     Hair loss  . Adhesive [Tape] Rash  . Cefdinir Itching and Rash  . Levaquin [Levofloxacin In D5w] Itching and Rash    ROS As per HPI  PE: Blood pressure 115/72, pulse 70, temperature 97.8 F (36.6 C), temperature source Temporal, resp. rate 16, weight 165 lb (74.8 kg), SpO2 99 %. Gen: Alert, well appearing.  Patient is oriented to person, place, time, and situation. R ear lobe with mild erythema and swelling, mildly tender to palpation.  Also slight erythema and tenderness to the cartilage of pinna on the back side of lower ear.  No streaking, no induration, no fluctuance.  EAC had cerumen but otherwise appeared normal.  LABS:  none  IMPRESSION AND PLAN:  Infection of skin of right ear lobe: she has multiple antibiotic allergies. I decided to use clindamycin 300 mg tid x 10d. Wash ear with soap once daily and try to keep hands off it otherwise. D/c neosporin.  An After Visit Summary was printed and given to the patient.  FOLLOW UP: Return if symptoms worsen or fail to improve.  Signed:  Santiago Bumpers, MD           12/12/2015

## 2015-12-14 ENCOUNTER — Ambulatory Visit: Payer: Federal, State, Local not specified - PPO | Admitting: Gastroenterology

## 2015-12-26 ENCOUNTER — Ambulatory Visit (INDEPENDENT_AMBULATORY_CARE_PROVIDER_SITE_OTHER): Payer: Federal, State, Local not specified - PPO | Admitting: Family Medicine

## 2015-12-26 ENCOUNTER — Encounter: Payer: Self-pay | Admitting: Family Medicine

## 2015-12-26 VITALS — BP 141/75 | HR 69 | Temp 98.2°F | Resp 16 | Ht 67.0 in | Wt 166.8 lb

## 2015-12-26 DIAGNOSIS — Z23 Encounter for immunization: Secondary | ICD-10-CM | POA: Diagnosis not present

## 2015-12-26 DIAGNOSIS — Z0000 Encounter for general adult medical examination without abnormal findings: Secondary | ICD-10-CM

## 2015-12-26 DIAGNOSIS — E7800 Pure hypercholesterolemia, unspecified: Secondary | ICD-10-CM

## 2015-12-26 LAB — LIPID PANEL
CHOL/HDL RATIO: 3
Cholesterol: 204 mg/dL — ABNORMAL HIGH (ref 0–200)
HDL: 61.9 mg/dL (ref >39.00)
LDL Cholesterol: 108 mg/dL — ABNORMAL HIGH (ref 0–99)
NonHDL: 142.57
TRIGLYCERIDES: 173 mg/dL — AB (ref 0.0–149.0)
VLDL: 34.6 mg/dL (ref 0.0–40.0)

## 2015-12-26 LAB — BASIC METABOLIC PANEL
BUN: 17 mg/dL (ref 6–23)
CALCIUM: 9.8 mg/dL (ref 8.4–10.5)
CO2: 30 meq/L (ref 19–32)
CREATININE: 0.84 mg/dL (ref 0.40–1.20)
Chloride: 104 mEq/L (ref 96–112)
GFR: 72.24 mL/min (ref >60.00)
Glucose, Bld: 87 mg/dL (ref 70–99)
Potassium: 4.9 mEq/L (ref 3.5–5.1)
Sodium: 141 mEq/L (ref 135–145)

## 2015-12-26 MED ORDER — OXYBUTYNIN CHLORIDE ER 15 MG PO TB24: 15.0000 mg | ORAL_TABLET | Freq: Every day | ORAL | 3 refills | Status: DC

## 2015-12-26 MED ORDER — ROPINIROLE HCL 1 MG PO TABS: ORAL_TABLET | ORAL | 3 refills | Status: AC

## 2015-12-26 MED ORDER — LOSARTAN POTASSIUM 100 MG PO TABS: 100.0000 mg | ORAL_TABLET | Freq: Every day | ORAL | 3 refills | Status: AC

## 2015-12-26 MED ORDER — ACETAMINOPHEN-CODEINE #4 300-60 MG PO TABS: 1.0000 | ORAL_TABLET | ORAL | 1 refills | Status: DC | PRN

## 2015-12-26 NOTE — Addendum Note (Signed)
Addended by: Regan RakersAY, Ali Mclaurin K on: 12/26/2015 09:51 AM   Modules accepted: Orders

## 2015-12-26 NOTE — Progress Notes (Signed)
Pre visit review using our clinic review tool, if applicable. No additional management support is needed unless otherwise documented below in the visit note. 

## 2015-12-26 NOTE — Progress Notes (Signed)
Office Note 12/26/2015  CC:  Chief Complaint  Patient presents with  . Annual Exam    CPE    HPI:  Crystal FerriesKathy Basques is a 65 y.o. White female who is here for annual health maintenance exam. No acute complaints.   Past Medical History:  Diagnosis Date  . Alopecia   . Bright red rectal bleeding    Painful; anal fissure suspected (although not seen on exam by her GYN specialist, Dr. Ashley RoyaltyMatthews).  . Dry eyes   . Gastritis EGD 07/2013   H pylori neg (mild chronic gastritis)  . GERD (gastroesophageal reflux disease)   . Herpes zoster 1976  . History of endometriosis   . History of vertigo   . Hypertension   . IBS (irritable bowel syndrome)    Constipation-predominant  . Migraine syndrome    Tylenol x 2 onset, then tylenol #4 if no help, then triptan if no help.  Marland Kitchen. OAB (overactive bladder)    Saw specialist, Dr. Ashley RoyaltyMatthews, 12/2014 and had her oxybutinyn increased and will start biofeedback.  . Osteoarthritis of both knees    "bone on bone" per pt--got TKA on left 09/01/14 (Dr. Dion SaucierLandau)  . Palpitations    Echo/Holter monitoring; cardiology eval 2015 all normal.  . Primary localized osteoarthritis of left knee 08/30/2014   Left TKA 08/30/14 (Dr. Dion SaucierLandau)  . Recurrent herpes labialis   . Restless legs syndrome   . Seasonal allergic rhinitis   . Varicose veins     Past Surgical History:  Procedure Laterality Date  . ABDOMINAL HYSTERECTOMY  1980s   endometriosis  . APPENDECTOMY  1983  . bladder tack  2010   vaginally, with mesh  . CHOLECYSTECTOMY, LAPAROSCOPIC  2014  . COLONOSCOPY  1989, 1992; 1997; q 5 yrs   Most recent 07/2010, hyperplastic polyp---recommended repeat 5-7 yrs  . ESOPHAGOGASTRODUODENOSCOPY  07/2013   gastritis; prilosec started  . Ovaries and tubes removed  1980s   a few years after uterus removed.  . ROBOTIC ASSISTED LAPAROSCOPIC SACROCOLPOPEXY  05/2013  . TONSILLECTOMY AND ADENOIDECTOMY  1966  . TOTAL KNEE ARTHROPLASTY Left 08/30/2014   Procedure: TOTAL KNEE  ARTHROPLASTY;  Surgeon: Teryl LucyJoshua Landau, MD;  Location: [facility] OR;  Service: Orthopedics;  Laterality: Left;    Family History  Problem Relation Age of Onset  . Colon cancer Mother 6760    ? blood clot? per ortho notes--need to clarify with pt  . CVA Father 6268  . CAD  7770    Social History   Social History  . Marital status: Married    Spouse name: N/A  . Number of children: N/A  . Years of education: N/A   Occupational History  . Not on file.   Social History Main Topics  . Smoking status: Never Smoker  . Smokeless tobacco: Never Used  . Alcohol use No  . Drug use: No  . Sexual activity: Not on file   Other Topics Concern  . Not on file   Social History Narrative   Married, has 2 sons, 1 grandson.   Relocated from [place] 2015, living in [place].   Occupation: retired from [employer].   No T/A/Ds.    Outpatient Medications Prior to Visit  Medication Sig Dispense Refill  . aspirin 81 MG tablet Take 81 mg by mouth daily.    . Biotin 5000 MCG CAPS Take 5,000 mcg by mouth daily.     . Calcium Carbonate-Vitamin D (CALCIUM + D PO) Take 1 tablet by mouth 2 (two) times  daily.     . cetirizine (ZYRTEC) 10 MG tablet Take 10 mg by mouth daily.    . meclizine (ANTIVERT) 25 MG tablet Take 25 mg by mouth as needed for dizziness.    . Multiple Vitamin (MULTIVITAMIN) tablet Take 1 tablet by mouth daily.    . naproxen sodium (ANAPROX) 220 MG tablet Take 220 mg by mouth 2 (two) times daily with a meal.    . omeprazole (PRILOSEC) 40 MG capsule Take 1 capsule (40 mg total) by mouth daily. 30 capsule 6  . Polyethyl Glycol-Propyl Glycol 0.4-0.3 % SOLN Place 1 drop into both eyes 3 (three) times daily as needed (dry eyes). 1 Drop Each Eye 2 To 3 Times A Day    . triamcinolone (NASACORT ALLERGY 24HR) 55 MCG/ACT AERO nasal inhaler Place 2 sprays into the nose daily.    . vitamin E 400 UNIT capsule Take 400 Units by mouth daily.    Marland Kitchen. acetaminophen-codeine (TYLENOL #4) 300-60 MG tablet Take 1 tablet  by mouth every 4 (four) hours as needed for moderate pain.    Marland Kitchen. losartan (COZAAR) 100 MG tablet TAKE 1 TABLET BY MOUTH EVERY DAY 90 tablet 3  . oxybutynin (DITROPAN XL) 15 MG 24 hr tablet Take 15 mg by mouth daily.    Marland Kitchen. rOPINIRole (REQUIP) 1 MG tablet TAKE 1 TABLET BY MOUTH EVERY MORNING AND EVERY EVENING 180 tablet 3  . meloxicam (MOBIC) 15 MG tablet Take 1 tablet (15 mg total) by mouth daily. (Patient not taking: Reported on 12/26/2015) 30 tablet 3  . clindamycin (CLEOCIN) 300 MG capsule Take 1 capsule (300 mg total) by mouth 3 (three) times daily. (Patient not taking: Reported on 12/26/2015) 30 capsule 0  . SUMAtriptan (IMITREX) 100 MG tablet Take 100 mg by mouth as needed for migraine or headache. May repeat in 2 hours if headache persists or recurs.     No facility-administered medications prior to visit.     Allergies  Allergen Reactions  . Amitiza [Lubiprostone] Hives and Shortness Of Breath  . Biaxin [Clarithromycin] Hives and Shortness Of Breath  . Iodine Swelling    Mouth and throat swelling  . Penicillins Hives and Shortness Of Breath  . Sulfa Antibiotics Hives and Shortness Of Breath  . Versed [Midazolam]   . Zithromax [Azithromycin] Hives and Shortness Of Breath  . Brimonidine Tartrate Itching and Swelling  . Lisinopril     Hair loss  . Adhesive [Tape] Rash  . Cefdinir Itching and Rash  . Levaquin [Levofloxacin In D5w] Itching and Rash    ROS Review of Systems  Constitutional: Negative for appetite change, chills, fatigue and fever.  HENT: Negative for congestion, dental problem, ear pain and sore throat.   Eyes: Negative for discharge, redness and visual disturbance.  Respiratory: Negative for cough, chest tightness, shortness of breath and wheezing.   Cardiovascular: Negative for chest pain, palpitations and leg swelling.  Gastrointestinal: Negative for abdominal pain, blood in stool, diarrhea, nausea and vomiting.  Genitourinary: Negative for difficulty urinating,  dysuria, flank pain, frequency, hematuria and urgency.  Musculoskeletal: Negative for arthralgias, back pain, joint swelling, myalgias and neck stiffness.  Skin: Negative for pallor and rash.  Neurological: Negative for dizziness, speech difficulty, weakness and headaches.  Hematological: Negative for adenopathy. Does not bruise/bleed easily.  Psychiatric/Behavioral: Negative for confusion and sleep disturbance. The patient is not nervous/anxious.     PE; Blood pressure (!) 141/75, pulse 69, temperature 98.2 F (36.8 C), temperature source Temporal, resp. rate 16, height  5\' 7"  (1.702 m), weight 166 lb 12.8 oz (75.7 kg), SpO2 100 %.  Pt examined with Judie Grieve, nurse, as chaperone. Gen: Alert, well appearing.  Patient is oriented to person, place, time, and situation. AFFECT: pleasant, lucid thought and speech. ENT: Ears: EACs clear, normal epithelium.  TMs with good light reflex and landmarks bilaterally.  Eyes: no injection, icteris, swelling, or exudate.  EOMI, PERRLA. Nose: no drainage or turbinate edema/swelling.  No injection or focal lesion.  Mouth: lips without lesion/swelling.  Oral mucosa pink and moist.  Dentition intact and without obvious caries or gingival swelling.  Oropharynx without erythema, exudate, or swelling.  Neck: supple/nontender.  No LAD, mass, or TM.  Carotid pulses 2+ bilaterally, without bruits. CV: RRR, no m/r/g.   LUNGS: CTA bilat, nonlabored resps, good aeration in all lung fields. ABD: soft, NT, ND, BS normal.  No hepatospenomegaly or mass.  No bruits. EXT: no clubbing, cyanosis, or edema.  Musculoskeletal: no joint swelling, erythema, warmth, or tenderness.  ROM of all joints intact. Skin - no sores or suspicious lesions or rashes or color changes   Pertinent labs:  Lab Results  Component Value Date   TSH 2.71 07/20/2014   Lab Results  Component Value Date   WBC 6.4 01/03/2015   HGB 13.8 01/03/2015   HCT 42.2 01/03/2015   MCV 89.3 01/03/2015    PLT 211.0 01/03/2015   Lab Results  Component Value Date   CREATININE 0.78 07/05/2015   BUN 17 07/05/2015   NA 140 07/05/2015   K 4.2 07/05/2015   CL 105 07/05/2015   CO2 28 07/05/2015   Lab Results  Component Value Date   ALT 18 07/05/2015   AST 19 07/05/2015   ALKPHOS 76 07/05/2015   BILITOT 0.6 07/05/2015   Lab Results  Component Value Date   CHOL 218 (H) 07/05/2015   Lab Results  Component Value Date   HDL 53.40 07/05/2015   Lab Results  Component Value Date   LDLCALC 136 (H) 07/05/2015   Lab Results  Component Value Date   TRIG 141.0 07/05/2015   Lab Results  Component Value Date   CHOLHDL 4 07/05/2015   Lab Results  Component Value Date   HGBA1C 5.4 04/13/2014   ASSESSMENT AND PLAN:   Health maintenance exam: Reviewed age and gender appropriate health maintenance issues (prudent diet, regular exercise, health risks of tobacco and excessive alcohol, use of seatbelts, fire alarms in home, use of sunscreen).  Also reviewed age and gender appropriate health screening as well as vaccine recommendations. Flu vaccine given today (reg dose). Will give prevnar 13 at next f/u visit since a power outage in clinic recently has possibly tainted our vaccines (besides reg dose flu). She declined DEXA scan for monetary reasons today. Next Mammogram due 05/2015. She is not a candidate for cerv ca screening since she has had a hysterectomy in remote past for benign reasons, has no hx of abnormal paps. She will be seeing Dr. Russella Dar for an initial visit 01/2016 and will discuss colonoscopy repeat with him at that time. Mild hyperlipidemia: repeat FLP today. BMET today.  An After Visit Summary was printed and given to the patient.  FOLLOW UP:  Return in about 6 months (around 06/24/2016) for routine chronic illness f/u.  Signed:  Santiago Bumpers, MD           12/26/2015

## 2016-01-09 ENCOUNTER — Other Ambulatory Visit: Payer: Self-pay | Admitting: Family Medicine

## 2016-01-09 MED ORDER — OMEPRAZOLE 40 MG PO CPDR: 40.0000 mg | DELAYED_RELEASE_CAPSULE | Freq: Every day | ORAL | 3 refills | Status: DC

## 2016-01-09 NOTE — Telephone Encounter (Signed)
RF request for omeprazole LOV: 12/26/15 Next ov: 06/27/16 Last written: 03/21/15 #30 w/ 6RF

## 2016-01-09 NOTE — Addendum Note (Signed)
Addended by: Smitty KnudsenSUTHERLAND, Kalyn Hofstra K on: 01/09/2016 12:14 PM   Modules accepted: Orders

## 2016-01-09 NOTE — Telephone Encounter (Signed)
Pt called requesting 90 day supply, Rx resent for 90 days.

## 2016-02-07 ENCOUNTER — Ambulatory Visit: Payer: Federal, State, Local not specified - PPO | Admitting: Gastroenterology

## 2016-03-11 ENCOUNTER — Encounter: Payer: Self-pay | Admitting: Gastroenterology

## 2016-03-29 ENCOUNTER — Other Ambulatory Visit (INDEPENDENT_AMBULATORY_CARE_PROVIDER_SITE_OTHER): Payer: Self-pay

## 2016-03-29 DIAGNOSIS — M25562 Pain in left knee: Secondary | ICD-10-CM

## 2016-03-29 DIAGNOSIS — M25561 Pain in right knee: Secondary | ICD-10-CM

## 2016-04-01 ENCOUNTER — Ambulatory Visit (INDEPENDENT_AMBULATORY_CARE_PROVIDER_SITE_OTHER): Payer: Federal, State, Local not specified - PPO | Admitting: Orthopaedic Surgery

## 2016-04-01 ENCOUNTER — Encounter: Payer: Self-pay | Admitting: Family Medicine

## 2016-04-01 ENCOUNTER — Ambulatory Visit (HOSPITAL_BASED_OUTPATIENT_CLINIC_OR_DEPARTMENT_OTHER)
Admission: RE | Admit: 2016-04-01 | Discharge: 2016-04-01 | Disposition: A | Discharge status: Home / Self Care | Payer: Federal, State, Local not specified - PPO | Source: Ambulatory Visit | Attending: Orthopaedic Surgery | Admitting: Orthopaedic Surgery

## 2016-04-01 DIAGNOSIS — M25562 Pain in left knee: Secondary | ICD-10-CM | POA: Insufficient documentation

## 2016-04-01 DIAGNOSIS — M1711 Unilateral primary osteoarthritis, right knee: Secondary | ICD-10-CM

## 2016-04-01 DIAGNOSIS — M25561 Pain in right knee: Secondary | ICD-10-CM | POA: Insufficient documentation

## 2016-04-01 DIAGNOSIS — M25462 Effusion, left knee: Secondary | ICD-10-CM | POA: Insufficient documentation

## 2016-04-01 DIAGNOSIS — G8929 Other chronic pain: Secondary | ICD-10-CM

## 2016-04-01 DIAGNOSIS — M25461 Effusion, right knee: Secondary | ICD-10-CM | POA: Insufficient documentation

## 2016-04-01 MED ORDER — LIDOCAINE HCL 1 % IJ SOLN
3.0000 mL | INTRAMUSCULAR | Status: AC | PRN
  Administered 2016-04-01: 3 mL

## 2016-04-01 MED ORDER — METHYLPREDNISOLONE ACETATE 40 MG/ML IJ SUSP
40.0000 mg | INTRAMUSCULAR | Status: AC | PRN
  Administered 2016-04-01: 40 mg via INTRA_ARTICULAR

## 2016-04-01 NOTE — Progress Notes (Signed)
Office Visit Note   Patient: Crystal Murray           Date of Birth: 1950/07/05           MRN: [id number] Visit Date: 04/01/2016              Requested by: Jeoffrey Massed, MD [address] [place], [place] [address] PCP: Jeoffrey Massed, MD   Assessment & Plan: Visit Diagnoses:  1. Unilateral primary osteoarthritis, right knee   2. Chronic pain of right knee     Plan: she tolerated the steroid injection well and her right knee.At this point she is a perfect  Candidate for hyaluronic acid for that knee. She's had success with that in the past for her left knee. We will order this for her right knee and see her back in follow-up to place this in the right knee.  Follow-Up Instructions: Return in about 4 weeks (around 04/29/2016).   Orders:  Orders Placed This Encounter  Procedures  . Large Joint Injection/Arthrocentesis   No orders of the defined types were placed in this encounter.     Procedures: Large Joint Inj Date/Time: 04/01/2016 9:58 AM Performed by: Kathryne Hitch Authorized by: Kathryne Hitch   Location:  Knee Site:  R knee Ultrasound Guidance: No   Fluoroscopic Guidance: No   Arthrogram: No   Medications:  3 mL lidocaine 1 %; 40 mg methylPREDNISolone acetate 40 MG/ML     Clinical Data: No additional findings.   Subjective: No chief complaint on file. The patient is a very pleasant 66 year old who comes in with a chief complaint of right knee pain and known osteoporosis of the right knee. She actually had something similar with her left knee in about 2 years ago underwent successful left total knee replacement. She will and the knee checked out and make sure with and was found with it since it clicks but overall that doing well. Her right knee pain is starting to affect her daily and it's detrimentally affecting her activities daily living, her quality of life, and her mobility. He can be 10 out of 10 at times it hurts mainly with  activities. She does take care of her husband who is 12 years old has dementia so she is wanting to try to stay with conservative treatment for now. Before she had and left knee replacement steroid injections and eventually hyaluronic acid work for a while. She would like to try that same treatment regimen for her right knee.  HPI  Review of Systems She currently denies any headache, shortness breath, fever, chills, nausea, vomiting, chest pain.  Objective: Vital Signs: There were no vitals taken for this visit.  Physical Exam She is a very pleasant individual and is alert and oriented 3 in no acute distress she is not walking with any significant limp Ortho Exam examination of her left knee shows a well-healed surgical incision with no effusion. Her patella tracks well she has excellent range of motion of that knee with some slight clicking which is just interface of the implants and the polyethylene liner. The knee feels ligamentously stable. Examination of her showssome patellofemoral crepitation and mainly medial joint line tenderness.the range of motion is full and her knee is ligamentously stable. Specialty Comments:  No specialty comments available.  Imaging: Dg Knee 1-2 Views Left  Result Date: 04/01/2016 CLINICAL DATA:  Chronic bilateral knee pain. The patient has undergone injection of both knees and is undergone a left  knee replacement. Symptoms now are greatest on the right with limitation of mobility. EXAM: RIGHT KNEE - 1-2 VIEW; LEFT KNEE - 1-2 VIEW COMPARISON:  Portable postop radiographs of the left knee dated August 30, 2014 FINDINGS: Left knee: The patient has undergone previous left knee joint prosthesis placement. Radiographic positioning of the prosthetic components is good. The interface with the native bone is normal. The native bone exhibits no acute abnormality. There is no joint effusion. Right knee: The bones are subjectively adequately mineralized. There is moderate  narrowing of all 3 joint compartments. There is beaking of the tibial spines. There is spurring of the superior articular margin of the patella. There is a small suprapatellar effusion. There is no acute or old fracture or dislocation. IMPRESSION: Moderate tricompartmental osteoarthritic joint space loss with osteophyte formation of the right knee. No acute bony abnormality. Small suprapatellar effusion. No acute or chronic abnormality of the prosthetic left knee joint or native bone. Electronically Signed   By: David  Swaziland M.D.   On: 04/01/2016 09:17   Dg Knee 1-2 Views Right  Result Date: 04/01/2016 CLINICAL DATA:  Chronic bilateral knee pain. The patient has undergone injection of both knees and is undergone a left knee replacement. Symptoms now are greatest on the right with limitation of mobility. EXAM: RIGHT KNEE - 1-2 VIEW; LEFT KNEE - 1-2 VIEW COMPARISON:  Portable postop radiographs of the left knee dated August 30, 2014 FINDINGS: Left knee: The patient has undergone previous left knee joint prosthesis placement. Radiographic positioning of the prosthetic components is good. The interface with the native bone is normal. The native bone exhibits no acute abnormality. There is no joint effusion. Right knee: The bones are subjectively adequately mineralized. There is moderate narrowing of all 3 joint compartments. There is beaking of the tibial spines. There is spurring of the superior articular margin of the patella. There is a small suprapatellar effusion. There is no acute or old fracture or dislocation. IMPRESSION: Moderate tricompartmental osteoarthritic joint space loss with osteophyte formation of the right knee. No acute bony abnormality. Small suprapatellar effusion. No acute or chronic abnormality of the prosthetic left knee joint or native bone. Electronically Signed   By: David  [place]and M.D.   On: 04/01/2016 09:17   I independently reviewed the x-rays of both her knees. Her left knee shows a  well-seated implant with no complicating features and no evidence of loosening. There is no effusion. Her right knee shows moderate tricompartmental arthritic changes.  PMFS History: Patient Active Problem List   Diagnosis Date Noted  . Hematuria 12/12/2014  . Dysuria 12/12/2014  . Primary localized osteoarthritis of left knee 08/30/2014  . Unilateral primary osteoarthritis, right knee 08/30/2014  . Osteoarthritis of both knees   . Essential hypertension 02/23/2014  . Urge incontinence 02/23/2014  . Restless legs syndrome 02/23/2014  . Preventative health care 02/23/2014   Past Medical History:  Diagnosis Date  . Alopecia   . Bright red rectal bleeding    Painful; anal fissure suspected (although not seen on exam by her GYN specialist, Dr. Ashley Royalty).  . Dry eyes   . Gastritis EGD 07/2013   H pylori neg (mild chronic gastritis)  . GERD (gastroesophageal reflux disease)   . Herpes zoster 1976  . History of endometriosis   . History of vertigo   . Hypertension   . IBS (irritable bowel syndrome)    Constipation-predominant  . Migraine syndrome    Tylenol x 2 onset, then tylenol #4 if no  help, then triptan if no help.  Marland Kitchen. OAB (overactive bladder)    Saw specialist, Dr. Ashley RoyaltyMatthews, 12/2014 and had her oxybutinyn increased and will start biofeedback.  . Osteoarthritis of both knees    "bone on bone" per pt--got TKA on left 09/01/14 (Dr. Dion SaucierLandau)  . Palpitations    Echo/Holter monitoring; cardiology eval 2015 all normal.  . Primary localized osteoarthritis of left knee 08/30/2014   Left TKA 08/30/14 (Dr. Dion SaucierLandau)  . Recurrent herpes labialis   . Restless legs syndrome   . Seasonal allergic rhinitis   . Varicose veins     Family History  Problem Relation Age of Onset  . Colon cancer Mother 9060    ? blood clot? per ortho notes--need to clarify with pt  . CVA Father 8068  . CAD  70    Past Surgical History:  Procedure Laterality Date  . ABDOMINAL HYSTERECTOMY  1980s   endometriosis    . APPENDECTOMY  1983  . bladder tack  2010   vaginally, with mesh  . CHOLECYSTECTOMY, LAPAROSCOPIC  2014  . COLONOSCOPY  1989, 1992; 1997; q 5 yrs   Most recent 07/2010, hyperplastic polyp---recommended repeat 5-7 yrs  . ESOPHAGOGASTRODUODENOSCOPY  07/2013   gastritis; prilosec started  . Ovaries and tubes removed  1980s   a few years after uterus removed.  . ROBOTIC ASSISTED LAPAROSCOPIC SACROCOLPOPEXY  05/2013  . TONSILLECTOMY AND ADENOIDECTOMY  1966  . TOTAL KNEE ARTHROPLASTY Left 08/30/2014   Procedure: TOTAL KNEE ARTHROPLASTY;  Surgeon: Teryl LucyJoshua Landau, MD;  Location: [facility] OR;  Service: Orthopedics;  Laterality: Left;   Social History   Occupational History  . Not on file.   Social History Main Topics  . Smoking status: Never Smoker  . Smokeless tobacco: Never Used  . Alcohol use No  . Drug use: No  . Sexual activity: Not on file

## 2016-04-22 ENCOUNTER — Ambulatory Visit (INDEPENDENT_AMBULATORY_CARE_PROVIDER_SITE_OTHER): Payer: Federal, State, Local not specified - PPO | Admitting: Gastroenterology

## 2016-04-22 ENCOUNTER — Other Ambulatory Visit (INDEPENDENT_AMBULATORY_CARE_PROVIDER_SITE_OTHER): Payer: Federal, State, Local not specified - PPO

## 2016-04-22 ENCOUNTER — Encounter: Payer: Self-pay | Admitting: Gastroenterology

## 2016-04-22 VITALS — BP 130/80 | HR 69 | Ht 67.0 in | Wt 169.0 lb

## 2016-04-22 DIAGNOSIS — Z800 Family history of malignant neoplasm of digestive organs: Secondary | ICD-10-CM

## 2016-04-22 DIAGNOSIS — K5900 Constipation, unspecified: Secondary | ICD-10-CM

## 2016-04-22 DIAGNOSIS — K219 Gastro-esophageal reflux disease without esophagitis: Secondary | ICD-10-CM

## 2016-04-22 DIAGNOSIS — R1033 Periumbilical pain: Secondary | ICD-10-CM

## 2016-04-22 DIAGNOSIS — Z86010 Personal history of colonic polyps: Secondary | ICD-10-CM

## 2016-04-22 LAB — COMPREHENSIVE METABOLIC PANEL
ALBUMIN: 4.5 g/dL (ref 3.5–5.2)
ALK PHOS: 85 U/L (ref 39–117)
ALT: 19 U/L (ref 0–35)
AST: 19 U/L (ref 0–37)
BUN: 15 mg/dL (ref 6–23)
CO2: 27 mEq/L (ref 19–32)
CREATININE: 0.9 mg/dL (ref 0.40–1.20)
Calcium: 9.8 mg/dL (ref 8.4–10.5)
Chloride: 103 mEq/L (ref 96–112)
GFR: 66.65 mL/min (ref >60.00)
Glucose, Bld: 97 mg/dL (ref 70–99)
Potassium: 4.2 mEq/L (ref 3.5–5.1)
SODIUM: 142 meq/L (ref 135–145)
TOTAL PROTEIN: 7.3 g/dL (ref 6.0–8.3)
Total Bilirubin: 0.5 mg/dL (ref 0.2–1.2)

## 2016-04-22 LAB — TSH: TSH: 2.73 u[IU]/mL (ref 0.35–4.50)

## 2016-04-22 LAB — CBC WITH DIFFERENTIAL/PLATELET
BASOS PCT: 0.5 % (ref 0.0–3.0)
Basophils Absolute: 0 10*3/uL (ref 0.0–0.1)
Eosinophils Absolute: 0.1 10*3/uL (ref 0.0–0.7)
Eosinophils Relative: 1.7 % (ref 0.0–5.0)
HCT: 42.6 % (ref 36.0–46.0)
HEMOGLOBIN: 14.1 g/dL (ref 12.0–15.0)
LYMPHS ABS: 1.5 10*3/uL (ref 0.7–4.0)
Lymphocytes Relative: 22.3 % (ref 12.0–46.0)
MCHC: 33.1 g/dL (ref 30.0–36.0)
MCV: 88.1 fl (ref 78.0–100.0)
MONO ABS: 0.4 10*3/uL (ref 0.1–1.0)
Monocytes Relative: 6.1 % (ref 3.0–12.0)
NEUTROS PCT: 69.4 % (ref 43.0–77.0)
Neutro Abs: 4.8 10*3/uL (ref 1.4–7.7)
Platelets: 193 10*3/uL (ref 150.0–400.0)
RBC: 4.84 Mil/uL (ref 3.87–5.11)
RDW: 13.9 % (ref 11.5–15.5)
WBC: 6.9 10*3/uL (ref 4.0–10.5)

## 2016-04-22 LAB — LIPASE: LIPASE: 18 U/L (ref 11.0–59.0)

## 2016-04-22 NOTE — Progress Notes (Signed)
History of Present Illness: This is a 83 female referred by Crystal Massed, MD for the evaluation of a history of colon polyps and family history of colon cancer. Last colonoscopy in 07/2010 in [place] without polyps per pt report. By her report she has had colonoscopies in 1994, 1998, 2001, 2007, 2012. Apparently had precancerous polyps in 1998 but none since. EGD in 07/2013 apparently showed gastritis. Chronic constipation for years with occasional diarrhea. Recently has noted post prandial periumbilical pain. Occasional break through reflux symptoms. Denies weight loss, achange in stool caliber, melena, hematochezia, nausea, vomiting, dysphagia, chest pain.    Allergies  Allergen Reactions  . Amitiza [Lubiprostone] Hives and Shortness Of Breath  . Biaxin [Clarithromycin] Hives and Shortness Of Breath  . Iodine Swelling    Mouth and throat swelling  . Penicillins Hives and Shortness Of Breath  . Sulfa Antibiotics Hives and Shortness Of Breath  . Versed [Midazolam]   . Zithromax [Azithromycin] Hives and Shortness Of Breath  . Brimonidine Tartrate Itching and Swelling  . Lisinopril     Hair loss  . Adhesive [Tape] Rash  . Cefdinir Itching and Rash  . Levaquin [Levofloxacin In D5w] Itching and Rash   Outpatient Medications Prior to Visit  Medication Sig Dispense Refill  . acetaminophen-codeine (TYLENOL #4) 300-60 MG tablet Take 1 tablet by mouth every 4 (four) hours as needed for moderate pain. 30 tablet 1  . aspirin 81 MG tablet Take 81 mg by mouth daily.    . Biotin 5000 MCG CAPS Take 5,000 mcg by mouth daily.     . Calcium Carbonate-Vitamin D (CALCIUM + D PO) Take 1 tablet by mouth 2 (two) times daily.     . cetirizine (ZYRTEC) 10 MG tablet Take 10 mg by mouth daily.    Marland Kitchen losartan (COZAAR) 100 MG tablet Take 1 tablet (100 mg total) by mouth daily. 90 tablet 3  . meclizine (ANTIVERT) 25 MG tablet Take 25 mg by mouth as needed for dizziness.    . Multiple Vitamin (MULTIVITAMIN)  tablet Take 1 tablet by mouth daily.    . naproxen sodium (ANAPROX) 220 MG tablet Take 220 mg by mouth 2 (two) times daily with a meal.    . omeprazole (PRILOSEC) 40 MG capsule Take 1 capsule (40 mg total) by mouth daily. 90 capsule 3  . oxybutynin (DITROPAN XL) 15 MG 24 hr tablet Take 1 tablet (15 mg total) by mouth daily. 90 tablet 3  . Polyethyl Glycol-Propyl Glycol 0.4-0.3 % SOLN Place 1 drop into both eyes 3 (three) times daily as needed (dry eyes). 1 Drop Each Eye 2 To 3 Times A Day    . rOPINIRole (REQUIP) 1 MG tablet TAKE 1 TABLET BY MOUTH EVERY MORNING AND EVERY EVENING 180 tablet 3  . triamcinolone (NASACORT ALLERGY 24HR) 55 MCG/ACT AERO nasal inhaler Place 2 sprays into the nose daily.    . vitamin E 400 UNIT capsule Take 400 Units by mouth daily.    . meloxicam (MOBIC) 15 MG tablet Take 1 tablet (15 mg total) by mouth daily. (Patient not taking: Reported on 12/26/2015) 30 tablet 3   No facility-administered medications prior to visit.    Past Medical History:  Diagnosis Date  . Alopecia   . Bright red rectal bleeding    Painful; anal fissure suspected (although not seen on exam by her GYN specialist, Dr. Ashley Royalty).  . Dry eyes   . Gastritis EGD 07/2013   H pylori neg (mild chronic  gastritis)  . GERD (gastroesophageal reflux disease)   . Herpes zoster 1976  . History of endometriosis   . History of vertigo   . Hypertension   . IBS (irritable bowel syndrome)    Constipation-predominant  . Migraine syndrome    Tylenol x 2 onset, then tylenol #4 if no help, then triptan if no help.  Marland Kitchen. OAB (overactive bladder)    Saw specialist, Dr. Ashley RoyaltyMatthews, 12/2014 and had her oxybutinyn increased and will start biofeedback.  . Osteoarthritis of both knees    "bone on bone" per pt--got TKA on left 09/01/14 (Dr. Dion SaucierLandau).  For right knee she is going to get hyaluronic acid injections as of 03/2016.  Marland Kitchen. Palpitations    Echo/Holter monitoring; cardiology eval 2015 all normal.  . Primary localized  osteoarthritis of left knee 08/30/2014   Left TKA 08/30/14 (Dr. Dion SaucierLandau)  . Recurrent herpes labialis   . Restless legs syndrome   . Seasonal allergic rhinitis   . Varicose veins    Past Surgical History:  Procedure Laterality Date  . ABDOMINAL HYSTERECTOMY  1980s   endometriosis  . APPENDECTOMY  1983  . bladder tack  2010   vaginally, with mesh  . CHOLECYSTECTOMY, LAPAROSCOPIC  2014  . COLONOSCOPY  1989, 1992; 1997; q 5 yrs   Most recent 07/2010, hyperplastic polyp---recommended repeat 5-7 yrs  . ESOPHAGOGASTRODUODENOSCOPY  07/2013   gastritis; prilosec started  . Ovaries and tubes removed  1980s   a few years after uterus removed.  . ROBOTIC ASSISTED LAPAROSCOPIC SACROCOLPOPEXY  05/2013  . TONSILLECTOMY AND ADENOIDECTOMY  1966  . TOTAL KNEE ARTHROPLASTY Left 08/30/2014   Procedure: TOTAL KNEE ARTHROPLASTY;  Surgeon: Teryl LucyJoshua Landau, MD;  Location: [facility] OR;  Service: Orthopedics;  Laterality: Left;   Social History   Social History  . Marital status: Married    Spouse name: N/A  . Number of children: N/A  . Years of education: N/A   Social History Main Topics  . Smoking status: Never Smoker  . Smokeless tobacco: Never Used  . Alcohol use No  . Drug use: No  . Sexual activity: Not Asked   Other Topics Concern  . None   Social History Narrative   Married, has 2 sons, 1 grandson.   Relocated from [place] 2015, living in [place].   Occupation: retired from [employer].   No T/A/Ds.   Family History  Problem Relation Age of Onset  . Colon cancer Mother 9060    ? blood clot? per ortho notes--need to clarify with pt  . CVA Father 1068  . CAD  70      Review of Systems: Pertinent positive and negative review of systems were noted in the above HPI section. All other review of systems were otherwise negative.   Physical Exam: General: Well developed, well nourished, no acute distress Head: Normocephalic and atraumatic Eyes:  sclerae anicteric, EOMI Ears: Normal auditory  acuity Mouth: No deformity or lesions Neck: Supple, no masses or thyromegaly Lungs: Clear throughout to auscultation Heart: Regular rate and rhythm; no murmurs, rubs or bruits Abdomen: Soft, non tender and non distended. No masses, hepatosplenomegaly or hernias noted. Normal Bowel sounds Rectal: deferred to colonosocpy Musculoskeletal: Symmetrical with no gross deformities  Skin: No lesions on visible extremities Pulses:  Normal pulses noted Extremities: No clubbing, cyanosis, edema or deformities noted Neurological: Alert oriented x 4, grossly nonfocal Cervical Nodes:  No significant cervical adenopathy Inguinal Nodes: No significant inguinal adenopathy Psychological:  Alert and cooperative. Normal mood and  affect  Assessment and Recommendations:  1. Personal history of colon polyps and family history of colon cancer. Request records and pathology from prior colonoscopies. She is due for a five-year colonoscopy. Patient relates she would like to schedule the colonoscopy sometime in June. 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 The risks (including bleeding, perforation, infection, missed lesions, medication reactions and possible hospitalization or surgery if complications occur), benefits, and alternatives to colonoscopy with possible biopsy and possible polypectomy were discussed with the patient and they consent to proceed.   2. Periumbilical abdominal pain, worsens with meals. CBC, CMP, lipase, TSH. Schedule abd/pelvic CT. If all unremarkable then a trial of dicyclomine 10 mg tid ac for presumed IBS related symptoms.   3. Constipation, chronic with occasional diarrhea. Continue high fiber diet and fiber supplements that she is currently doing. Maintain adequate daily water intake. Milk of magnesia daily as needed.  4. GERD. Closely follow standard antireflux measures and  continue omeprazole 40 mg daily. May use Zantac 150 mg daily or TUMS when necessary for breakthrough symptoms. Request records from prior EGD in June 2015.   cc: Crystal Massed, MD [address] [place], [place] [address]

## 2016-04-22 NOTE — Patient Instructions (Signed)
Your physician has requested that you go to the basement for lab work before leaving today.  You have been scheduled for a CT scan of the abdomen and pelvis at [facility] ([address]---this is in the same building as [facility]).   You are scheduled on 05/09/16 at 9:30am. You should arrive 15 minutes prior to your appointment time for registration. Please follow the written instructions below on the day of your exam:  WARNING: IF YOU ARE ALLERGIC TO IODINE/X-RAY DYE, PLEASE NOTIFY RADIOLOGY IMMEDIATELY AT [phone]! YOU WILL BE GIVEN A 13 HOUR PREMEDICATION PREP.  1) Do not eat or drink anything after 5:30am (4 hours prior to your test) 2) You have been given 2 bottles of oral contrast to drink. The solution may taste               better if refrigerated, but do NOT add ice or any other liquid to this solution. Shake             well before drinking.    Drink 1 bottle of contrast @ 7:30am (2 hours prior to your exam)  Drink 1 bottle of contrast @ 8:30am (1 hour prior to your exam)  You may take any medications as prescribed with a small amount of water except for the following: Metformin, Glucophage, Glucovance, Avandamet, Riomet, Fortamet, Actoplus Met, Janumet, Glumetza or Metaglip. The above medications must be held the day of the exam AND 48 hours after the exam.  The purpose of you drinking the oral contrast is to aid in the visualization of your intestinal tract. The contrast solution may cause some diarrhea. Before your exam is started, you will be given a small amount of fluid to drink. Depending on your individual set of symptoms, you may also receive an intravenous injection of x-ray contrast/dye. Plan on being at [facility] for 30 minutes or longer, depending on the type of exam you are having performed.  This test typically takes 30-45 minutes to complete.  If you have any questions regarding your exam or if you need to reschedule, you may call the  CT department at [phone] between the hours of 8:00 am and 5:00 pm, Monday-Friday.  ________________________________________________________________________   It has been recommended to you by your physician that you have a(n) Colonoscopy completed. Per your request, we did not schedule the procedure(s) today. Please contact our office at [phone] should you decide to have the procedure completed.  Thank you for choosing me and [facility].  Pricilla Riffle. Dagoberto Ligas., MD., [facility] Regal

## 2016-04-24 ENCOUNTER — Telehealth (INDEPENDENT_AMBULATORY_CARE_PROVIDER_SITE_OTHER): Payer: Self-pay | Admitting: *Deleted

## 2016-04-24 NOTE — Telephone Encounter (Signed)
Pt calling asking if the injection was approved. Pt has appt 3/12 in [place] for this injection but needs to cancel if not approved yet

## 2016-04-25 ENCOUNTER — Telehealth (INDEPENDENT_AMBULATORY_CARE_PROVIDER_SITE_OTHER): Payer: Self-pay | Admitting: Orthopaedic Surgery

## 2016-04-25 NOTE — Telephone Encounter (Signed)
Please advise 

## 2016-04-25 NOTE — Telephone Encounter (Signed)
Dalisa with [facility] called advised the co-pay will be for the patient using Synvisc One is $270.00. Dalisa advised due the insurance asked if the patient can get the Gel One? The number to contact Jonna ClarkDalisa is [phone]   The fax# is [phone]

## 2016-04-25 NOTE — Telephone Encounter (Signed)
I'm fine with the "Gel One" whatever it is if we can get it

## 2016-04-26 ENCOUNTER — Telehealth: Payer: Self-pay | Admitting: Gastroenterology

## 2016-04-26 NOTE — Telephone Encounter (Signed)
Patient called back to discuss CT scan.  She is advised that the CT scan will not look at the esophagus.  She will call back if she has any additional questions or concerns.

## 2016-04-26 NOTE — Telephone Encounter (Signed)
Left message for patient to call back  

## 2016-04-26 NOTE — Telephone Encounter (Signed)
Ordered the GelOne to be delivered

## 2016-04-29 ENCOUNTER — Telehealth (INDEPENDENT_AMBULATORY_CARE_PROVIDER_SITE_OTHER): Payer: Self-pay | Admitting: Orthopaedic Surgery

## 2016-04-29 ENCOUNTER — Ambulatory Visit (INDEPENDENT_AMBULATORY_CARE_PROVIDER_SITE_OTHER): Payer: Federal, State, Local not specified - PPO | Admitting: Orthopaedic Surgery

## 2016-04-29 NOTE — Telephone Encounter (Signed)
Pt requested a call back to discuss what she insurance told her regarding injections she is suppose to have.  [phone]

## 2016-05-02 ENCOUNTER — Telehealth (INDEPENDENT_AMBULATORY_CARE_PROVIDER_SITE_OTHER): Payer: Self-pay | Admitting: Orthopaedic Surgery

## 2016-05-02 NOTE — Telephone Encounter (Signed)
Patient called asked if the Gel One has come in yet. Patient said she has an appointment 05/06/16. The number to contact patient is [phone]

## 2016-05-02 NOTE — Telephone Encounter (Signed)
Duplicate message in box already for [facility]shley

## 2016-05-03 NOTE — Telephone Encounter (Signed)
Patient aware that I still have not yet heard anything

## 2016-05-06 ENCOUNTER — Ambulatory Visit (INDEPENDENT_AMBULATORY_CARE_PROVIDER_SITE_OTHER): Payer: Federal, State, Local not specified - PPO | Admitting: Orthopaedic Surgery

## 2016-05-07 ENCOUNTER — Telehealth (INDEPENDENT_AMBULATORY_CARE_PROVIDER_SITE_OTHER): Payer: Self-pay | Admitting: Orthopaedic Surgery

## 2016-05-07 NOTE — Telephone Encounter (Signed)
Amy with [facility] called advised will deliver Gel One 05/09/16 to the [facility].  She advised this is the last refill. The number to contact Amy is [phone]

## 2016-05-07 NOTE — Telephone Encounter (Signed)
Noted  

## 2016-05-08 ENCOUNTER — Telehealth (INDEPENDENT_AMBULATORY_CARE_PROVIDER_SITE_OTHER): Payer: Self-pay | Admitting: *Deleted

## 2016-05-08 NOTE — Telephone Encounter (Signed)
noted 

## 2016-05-08 NOTE — Telephone Encounter (Signed)
Pt had an appt 3/26 in [place] but her sister is not well and she is traveling to [place]. Pt stated she paid the copay for the Gel One for this appt but has a new appt 4/9.

## 2016-05-09 ENCOUNTER — Other Ambulatory Visit: Payer: Federal, State, Local not specified - PPO

## 2016-05-13 ENCOUNTER — Ambulatory Visit (INDEPENDENT_AMBULATORY_CARE_PROVIDER_SITE_OTHER): Payer: Federal, State, Local not specified - PPO | Admitting: Orthopaedic Surgery

## 2016-05-16 ENCOUNTER — Inpatient Hospital Stay: Admission: RE | Admit: 2016-05-16 | Payer: Federal, State, Local not specified - PPO | Source: Ambulatory Visit

## 2016-05-22 ENCOUNTER — Telehealth (INDEPENDENT_AMBULATORY_CARE_PROVIDER_SITE_OTHER): Payer: Self-pay | Admitting: *Deleted

## 2016-05-22 NOTE — Telephone Encounter (Signed)
Called pt, advised her of this message.

## 2016-05-22 NOTE — Telephone Encounter (Signed)
Pt calling asking if we have received GelOne has been delivered to our office CB: [phone]

## 2016-05-22 NOTE — Telephone Encounter (Signed)
Can you do me a favor and let her know it IS here.

## 2016-05-27 ENCOUNTER — Ambulatory Visit (INDEPENDENT_AMBULATORY_CARE_PROVIDER_SITE_OTHER): Payer: Federal, State, Local not specified - PPO | Admitting: Orthopaedic Surgery

## 2016-05-27 DIAGNOSIS — M1711 Unilateral primary osteoarthritis, right knee: Secondary | ICD-10-CM

## 2016-05-27 NOTE — Progress Notes (Signed)
The patient is here for a scheduled hyaluronic acid injection in her right knee. She's had a previous left total knee replacement but so for injections of helped her for her right knee.  On examination of her right knee there is no effusion with good range of motion that is slightly painful. She has known moderate arthritis in that knee.  Up at the superior lateral aspect of her knee with Betadine and an alcohol swab and placed the hyaluronic acid injection in her right knee without difficulty.  At this point she'll follow-up as needed. She understands that we can always place a steroid injection sometime this summer in her right knee if needed.

## 2016-06-12 ENCOUNTER — Telehealth: Payer: Self-pay

## 2016-06-12 DIAGNOSIS — R1033 Periumbilical pain: Secondary | ICD-10-CM

## 2016-06-12 NOTE — Telephone Encounter (Signed)
I spoke with [facility]. Patient can come for labs there.  Labs need to be entered as future and clinic collect. Patient notified

## 2016-06-14 ENCOUNTER — Other Ambulatory Visit (INDEPENDENT_AMBULATORY_CARE_PROVIDER_SITE_OTHER): Payer: Federal, State, Local not specified - PPO

## 2016-06-14 ENCOUNTER — Other Ambulatory Visit: Payer: Self-pay | Admitting: Family Medicine

## 2016-06-14 ENCOUNTER — Encounter: Payer: Self-pay | Admitting: Gastroenterology

## 2016-06-14 DIAGNOSIS — R1033 Periumbilical pain: Secondary | ICD-10-CM | POA: Diagnosis not present

## 2016-06-14 DIAGNOSIS — Z1231 Encounter for screening mammogram for malignant neoplasm of breast: Secondary | ICD-10-CM

## 2016-06-14 LAB — BUN: BUN: 25 mg/dL — ABNORMAL HIGH (ref 6–23)

## 2016-06-14 LAB — CREATININE, SERUM: CREATININE: 0.83 mg/dL (ref 0.40–1.20)

## 2016-06-17 ENCOUNTER — Telehealth: Payer: Self-pay | Admitting: Gastroenterology

## 2016-06-17 NOTE — Telephone Encounter (Signed)
Patient returning Amanda's call about lab results from 4.27.18

## 2016-06-17 NOTE — Telephone Encounter (Signed)
Patient notified of lab results and patient verbalized understanding.

## 2016-06-18 ENCOUNTER — Ambulatory Visit
Admission: RE | Admit: 2016-06-18 | Discharge: 2016-06-18 | Disposition: A | Discharge status: Home / Self Care | Payer: Federal, State, Local not specified - PPO | Source: Ambulatory Visit | Attending: Family Medicine | Admitting: Family Medicine

## 2016-06-18 DIAGNOSIS — Z1231 Encounter for screening mammogram for malignant neoplasm of breast: Secondary | ICD-10-CM

## 2016-06-19 ENCOUNTER — Telehealth: Payer: Self-pay

## 2016-06-19 MED ORDER — DIPHENHYDRAMINE HCL 50 MG PO TABS: ORAL_TABLET | ORAL | 0 refills | Status: DC

## 2016-06-19 MED ORDER — PREDNISONE 50 MG PO TABS: ORAL_TABLET | ORAL | 0 refills | Status: DC

## 2016-06-19 NOTE — Telephone Encounter (Signed)
Crystal Murray called from [facility] CT stating the patient needs to be pre-medicated for CT scan since she is allergic to Iodine. Pt will need to take prednisone 50 mg 13 hours, 7 hours and 1 hour prior to study.and Benadryl 50 mg 1 hour prior to study. Informed patient that I will send this to her pharmacy with instructions and also when to take these medications. Patient verbalized understanding. Patient also states she will probably vomit up the contrast and wants to know what to do. I informed patient to try to slow down if she fee like she is going to get sick and call the office if she does get sick. Patient agreed.

## 2016-06-21 ENCOUNTER — Ambulatory Visit (INDEPENDENT_AMBULATORY_CARE_PROVIDER_SITE_OTHER)
Admission: RE | Admit: 2016-06-21 | Discharge: 2016-06-21 | Disposition: A | Discharge status: Home / Self Care | Payer: Federal, State, Local not specified - PPO | Source: Ambulatory Visit | Attending: Gastroenterology | Admitting: Gastroenterology

## 2016-06-21 ENCOUNTER — Encounter: Payer: Self-pay | Admitting: Radiology

## 2016-06-21 DIAGNOSIS — R1033 Periumbilical pain: Secondary | ICD-10-CM | POA: Diagnosis not present

## 2016-06-21 MED ORDER — IOPAMIDOL (ISOVUE-300) INJECTION 61%
100.0000 mL | Freq: Once | INTRAVENOUS | Status: AC | PRN
  Administered 2016-06-21: 100 mL via INTRAVENOUS

## 2016-06-27 ENCOUNTER — Ambulatory Visit: Payer: Federal, State, Local not specified - PPO | Admitting: Family Medicine

## 2016-07-25 ENCOUNTER — Encounter: Payer: Self-pay | Admitting: Family Medicine

## 2016-07-25 ENCOUNTER — Ambulatory Visit (INDEPENDENT_AMBULATORY_CARE_PROVIDER_SITE_OTHER): Payer: Federal, State, Local not specified - PPO | Admitting: Family Medicine

## 2016-07-25 VITALS — BP 129/61 | HR 74 | Temp 97.6°F | Resp 16 | Ht 67.0 in | Wt 168.2 lb

## 2016-07-25 DIAGNOSIS — F4320 Adjustment disorder, unspecified: Secondary | ICD-10-CM

## 2016-07-25 DIAGNOSIS — I10 Essential (primary) hypertension: Secondary | ICD-10-CM | POA: Diagnosis not present

## 2016-07-25 DIAGNOSIS — K219 Gastro-esophageal reflux disease without esophagitis: Secondary | ICD-10-CM

## 2016-07-25 DIAGNOSIS — F5102 Adjustment insomnia: Secondary | ICD-10-CM | POA: Diagnosis not present

## 2016-07-25 DIAGNOSIS — F4321 Adjustment disorder with depressed mood: Secondary | ICD-10-CM

## 2016-07-25 MED ORDER — ALPRAZOLAM 0.25 MG PO TABS: 0.2500 mg | ORAL_TABLET | Freq: Every day | ORAL | 5 refills | Status: AC

## 2016-07-25 NOTE — Progress Notes (Signed)
OFFICE VISIT  07/25/2016   CC:  Chief Complaint  Patient presents with  . Follow-up    RCI, pt if not fasting.    HPI:    Patient is a 66 y.o. Caucasian female who presents for 6 mo f/u HTN, GERD, and RLS. She is morose today: lots of bad things going on: sister, grandson, dog, and an aunt died recently. Septic system problem at home.  Feeling overwhelmed.  Grieving appropriately.  Has some good days and bad days. Not sleeping well, brain won't shut off.  Says xanax hs has been helpful for this in the past.  HTN: no home bp monitoring. No exercise.  GERD: stable, compliant with daily PPI and GERD diet.  RLS: stable, compliant normal.  She saw Dr. Russella Dar for some periumbilical abd pains, got a CT scan that was normal.  She is having a colonoscopy in late July.  Past Medical History:  Diagnosis Date  . Alopecia   . Bright red rectal bleeding    Painful; anal fissure suspected (although not seen on exam by her GYN specialist, Dr. Ashley Royalty).  . Dry eyes   . Gastritis EGD 07/2013   H pylori neg (mild chronic gastritis)  . GERD (gastroesophageal reflux disease)   . Herpes zoster 1976  . History of endometriosis   . History of vertigo   . Hypertension   . IBS (irritable bowel syndrome)    Constipation-predominant  . Migraine syndrome    Tylenol x 2 onset, then tylenol #4 if no help, then triptan if no help.  Marland Kitchen OAB (overactive bladder)    Saw specialist, Dr. Ashley Royalty, 12/2014 and had her oxybutinyn increased and will start biofeedback.  . Osteoarthritis of both knees    "bone on bone" per pt--got TKA on left 09/01/14 (Dr. Dion Saucier).  For right knee she is going to get hyaluronic acid injections as of 03/2016.  Marland Kitchen Palpitations    Echo/Holter monitoring; cardiology eval 2015 all normal.  . Primary localized osteoarthritis of left knee 08/30/2014   Left TKA 08/30/14 (Dr. Dion Saucier)  . Recurrent herpes labialis   . Restless legs syndrome   . Seasonal allergic rhinitis   . Varicose veins      Past Surgical History:  Procedure Laterality Date  . ABDOMINAL HYSTERECTOMY  1980s   endometriosis  . APPENDECTOMY  1983  . bladder tack  2010   vaginally, with mesh  . CHOLECYSTECTOMY, LAPAROSCOPIC  2014  . COLONOSCOPY  1989, 1992; 1997; q 5 yrs   Most recent 07/2010, hyperplastic polyp---recommended repeat 5-7 yrs  . ESOPHAGOGASTRODUODENOSCOPY  07/2013   gastritis; prilosec started  . Ovaries and tubes removed  1980s   a few years after uterus removed.  . ROBOTIC ASSISTED LAPAROSCOPIC SACROCOLPOPEXY  05/2013  . TONSILLECTOMY AND ADENOIDECTOMY  1966  . TOTAL KNEE ARTHROPLASTY Left 08/30/2014   Procedure: TOTAL KNEE ARTHROPLASTY;  Surgeon: Teryl Lucy, MD;  Location: [facility] OR;  Service: Orthopedics;  Laterality: Left;    Outpatient Medications Prior to Visit  Medication Sig Dispense Refill  . acetaminophen-codeine (TYLENOL #4) 300-60 MG tablet Take 1 tablet by mouth every 4 (four) hours as needed for moderate pain. 30 tablet 1  . aspirin 81 MG tablet Take 81 mg by mouth daily.    . Biotin 5000 MCG CAPS Take 5,000 mcg by mouth daily.     . Calcium Carbonate-Vitamin D (CALCIUM + D PO) Take 1 tablet by mouth 2 (two) times daily.     . cetirizine (ZYRTEC) 10  MG tablet Take 10 mg by mouth daily.    Marland Kitchen losartan (COZAAR) 100 MG tablet Take 1 tablet (100 mg total) by mouth daily. 90 tablet 3  . meclizine (ANTIVERT) 25 MG tablet Take 25 mg by mouth as needed for dizziness.    . Multiple Vitamin (MULTIVITAMIN) tablet Take 1 tablet by mouth daily.    . naproxen sodium (ANAPROX) 220 MG tablet Take 220 mg by mouth 2 (two) times daily with a meal.    . omeprazole (PRILOSEC) 40 MG capsule Take 1 capsule (40 mg total) by mouth daily. 90 capsule 3  . oxybutynin (DITROPAN XL) 15 MG 24 hr tablet Take 1 tablet (15 mg total) by mouth daily. 90 tablet 3  . Polyethyl Glycol-Propyl Glycol 0.4-0.3 % SOLN Place 1 drop into both eyes 3 (three) times daily as needed (dry eyes). 1 Drop Each Eye 2 To 3 Times A  Day    . rOPINIRole (REQUIP) 1 MG tablet TAKE 1 TABLET BY MOUTH EVERY MORNING AND EVERY EVENING 180 tablet 3  . triamcinolone (NASACORT ALLERGY 24HR) 55 MCG/ACT AERO nasal inhaler Place 2 sprays into the nose daily.    . vitamin E 400 UNIT capsule Take 400 Units by mouth daily.    . diphenhydrAMINE (BENADRYL) 50 MG tablet Take one tablet by mouth 1 hour prior to study (Patient not taking: Reported on 07/25/2016) 1 tablet 0  . predniSONE (DELTASONE) 50 MG tablet Take one tablet by mouth 13 hours, 7 hours and 1 hour prior to study. (Patient not taking: Reported on 07/25/2016) 3 tablet 0   No facility-administered medications prior to visit.     Allergies  Allergen Reactions  . Amitiza [Lubiprostone] Hives and Shortness Of Breath  . Biaxin [Clarithromycin] Hives and Shortness Of Breath  . Iodine Swelling    Mouth and throat swelling  . Penicillins Hives and Shortness Of Breath  . Sulfa Antibiotics Hives and Shortness Of Breath  . Versed [Midazolam]   . Zithromax [Azithromycin] Hives and Shortness Of Breath  . Brimonidine Tartrate Itching and Swelling  . Lisinopril     Hair loss  . Prednisone Nausea Only    Hot flash, dizziness  . Adhesive [Tape] Rash  . Cefdinir Itching and Rash  . Levaquin [Levofloxacin In D5w] Itching and Rash    ROS As per HPI  PE: Blood pressure 129/61, pulse 74, temperature 97.6 F (36.4 C), temperature source Oral, resp. rate 16, height 5\' 7"  (1.702 m), weight 168 lb 4 oz (76.3 kg), SpO2 100 %. Gen: Alert, well appearing.  Patient is oriented to person, place, time, and situation. AFFECT: pleasant, lucid thought and speech. No further exam today.  LABS:  Lab Results  Component Value Date   TSH 2.73 04/22/2016   Lab Results  Component Value Date   WBC 6.9 04/22/2016   HGB 14.1 04/22/2016   HCT 42.6 04/22/2016   MCV 88.1 04/22/2016   PLT 193.0 04/22/2016   Lab Results  Component Value Date   CREATININE 0.83 06/14/2016   BUN 25 (H) 06/14/2016   NA  142 04/22/2016   K 4.2 04/22/2016   CL 103 04/22/2016   CO2 27 04/22/2016   Lab Results  Component Value Date   ALT 19 04/22/2016   AST 19 04/22/2016   ALKPHOS 85 04/22/2016   BILITOT 0.5 04/22/2016   Lab Results  Component Value Date   CHOL 204 (H) 12/26/2015   Lab Results  Component Value Date   HDL 61.90 12/26/2015  Lab Results  Component Value Date   LDLCALC 108 (H) 12/26/2015   Lab Results  Component Value Date   TRIG 173.0 (H) 12/26/2015   Lab Results  Component Value Date   CHOLHDL 3 12/26/2015   Lab Results  Component Value Date   HGBA1C 5.4 04/13/2014   IMPRESSION AND PLAN:  1) HTN: The current medical regimen is effective;  continue present plan and medications. Lytes/cr normal 04/2016.  2) GERD: stable.  Continue daily PPI and GER diet.  3) RLS stable on ropinirole.  The current medical regimen is effective;  continue present plan and medications.  4) Grieving-related-insomnia: restart alprazolam 0.25mg  qhs that has helped her significantly in the past during times like this.  5) Periumbilical abd pains: w/u with GI unrevealing this far, has plans for colonoscopy late next month.  No labs needed today.  Needs Prevnar 13--wants to wait on this today.  Discuss shingrix next f/u for CPE.  An After Visit Summary was printed and given to the patient.  FOLLOW UP: Return for CPE in 5-6 months (fasting).  Signed:  Santiago BumpersPhil Chistina Roston, MD           07/25/2016

## 2016-08-06 ENCOUNTER — Encounter (INDEPENDENT_AMBULATORY_CARE_PROVIDER_SITE_OTHER): Payer: Self-pay | Admitting: Orthopaedic Surgery

## 2016-08-06 ENCOUNTER — Ambulatory Visit (INDEPENDENT_AMBULATORY_CARE_PROVIDER_SITE_OTHER): Payer: Federal, State, Local not specified - PPO | Admitting: Orthopaedic Surgery

## 2016-08-06 VITALS — Ht 67.0 in | Wt 168.0 lb

## 2016-08-06 DIAGNOSIS — M1711 Unilateral primary osteoarthritis, right knee: Secondary | ICD-10-CM

## 2016-08-06 MED ORDER — METHYLPREDNISOLONE ACETATE 40 MG/ML IJ SUSP
40.0000 mg | INTRAMUSCULAR | Status: AC | PRN
  Administered 2016-08-06: 40 mg via INTRA_ARTICULAR

## 2016-08-06 MED ORDER — LIDOCAINE HCL 1 % IJ SOLN
3.0000 mL | INTRAMUSCULAR | Status: AC | PRN
  Administered 2016-08-06: 3 mL

## 2016-08-06 NOTE — Progress Notes (Signed)
Office Visit Note   Patient: Crystal FerriesKathy Snellings           Date of Birth: [birth year]           MRN: [id number] Visit Date: 08/06/2016              Requested by: Jeoffrey MassedMcGowen, Philip H, MD [address] [place], [place] [id number] PCP: Jeoffrey MassedMcGowen, Philip H, MD   Assessment & Plan: Visit Diagnoses:  1. Unilateral primary osteoarthritis, right knee     Plan: I agree with her having a steroid injection in her right knee today. She'll continue work on Advice workerquad training exercises. We talked about the risk and benefits of the various injections as well. She knows to wait at least 6 months in between hyaluronic acid injections in 3-4 months between steroid injections in her knee. All questions were encouraged and answered.  Follow-Up Instructions: Return if symptoms worsen or fail to improve.   Orders:  Orders Placed This Encounter  Procedures  . Large Joint Injection/Arthrocentesis   No orders of the defined types were placed in this encounter.     Procedures: Large Joint Inj Date/Time: 08/06/2016 9:46 AM Performed by: Kathryne HitchBLACKMAN, Shields Pautz Y Authorized by: Kathryne HitchBLACKMAN, Bobby Barton Y   Location:  Knee Site:  R knee Ultrasound Guidance: No   Fluoroscopic Guidance: No   Arthrogram: No   Medications:  3 mL lidocaine 1 %; 40 mg methylPREDNISolone acetate 40 MG/ML     Clinical Data: No additional findings.   Subjective: Chief Complaint  Patient presents with  . Right Knee - Pain    S/p gel injection 05/27/16  The patient is well-known to me. She has moderate osteoarthritis of her right knee. She had a hyaluronic acid injection at the back in early April she like a steroid injection today in her right knee. She says the pain is about the same. Hurts more with activities than with rest. She's working on quad training exercises well. She is trying to be as active as possible. She has a history of a left total knee arthroplasty done in 2016 is not ready for this on the right side  yet.  HPI  Review of Systems He currently denies any headache, chest pain, shortness of breath, fever, chills, nausea, vomiting.  Objective: Vital Signs: Ht 5\' 7"  (1.702 m)   Wt 168 lb (76.2 kg)   BMI 26.31 kg/m   Physical Exam She is alert and oriented 3 and in no acute distress Ortho Exam Examination of her right knee shows some mild medial joint line tenderness as well as patellofemoral crepitation but no significant effusion today with excellent range of motion. Specialty Comments:  No specialty comments available.  Imaging: No results found.   PMFS History: Patient Active Problem List   Diagnosis Date Noted  . Hematuria 12/12/2014  . Dysuria 12/12/2014  . Unilateral primary osteoarthritis, right knee 08/30/2014  . Osteoarthritis of both knees   . Essential hypertension 02/23/2014  . Urge incontinence 02/23/2014  . Restless legs syndrome 02/23/2014  . Preventative health care 02/23/2014   Past Medical History:  Diagnosis Date  . Alopecia   . Bright red rectal bleeding    Painful; anal fissure suspected (although not seen on exam by her GYN specialist, Dr. Ashley RoyaltyMatthews).  . Dry eyes   . Gastritis EGD 07/2013   H pylori neg (mild chronic gastritis)  . GERD (gastroesophageal reflux disease)   . Herpes zoster 1976  . History of endometriosis   .  History of vertigo   . Hypertension   . IBS (irritable bowel syndrome)    Constipation-predominant  . Migraine syndrome    Tylenol x 2 onset, then tylenol #4 if no help, then triptan if no help.  Marland Kitchen OAB (overactive bladder)    Saw specialist, Dr. Ashley Royalty, 12/2014 and had her oxybutinyn increased and will start biofeedback.  . Osteoarthritis of both knees    "bone on bone" per pt--got TKA on left 09/01/14 (Dr. Dion Saucier).  For right knee she is going to get hyaluronic acid injections as of 03/2016.  Marland Kitchen Palpitations    Echo/Holter monitoring; cardiology eval 2015 all normal.  . Primary localized osteoarthritis of left knee  08/30/2014   Left TKA 08/30/14 (Dr. Dion Saucier)  . Recurrent herpes labialis   . Restless legs syndrome   . Seasonal allergic rhinitis   . Varicose veins     Family History  Problem Relation Age of Onset  . Colon cancer Mother 73       ? blood clot? per ortho notes--need to clarify with pt  . CVA Father 67  . CAD Unknown 20    Past Surgical History:  Procedure Laterality Date  . ABDOMINAL HYSTERECTOMY  1980s   endometriosis  . APPENDECTOMY  1983  . bladder tack  2010   vaginally, with mesh  . CHOLECYSTECTOMY, LAPAROSCOPIC  2014  . COLONOSCOPY  1989, 1992; 1997; q 5 yrs   Most recent 07/2010, hyperplastic polyp---recommended repeat 5-7 yrs  . ESOPHAGOGASTRODUODENOSCOPY  07/2013   gastritis; prilosec started  . Ovaries and tubes removed  1980s   a few years after uterus removed.  . ROBOTIC ASSISTED LAPAROSCOPIC SACROCOLPOPEXY  05/2013  . TONSILLECTOMY AND ADENOIDECTOMY  1966  . TOTAL KNEE ARTHROPLASTY Left 08/30/2014   Procedure: TOTAL KNEE ARTHROPLASTY;  Surgeon: Teryl Lucy, MD;  Location: [facility] OR;  Service: Orthopedics;  Laterality: Left;   Social History   Occupational History  . Not on file.   Social History Main Topics  . Smoking status: Never Smoker  . Smokeless tobacco: Never Used  . Alcohol use No  . Drug use: No  . Sexual activity: Not on file

## 2016-08-15 ENCOUNTER — Ambulatory Visit (AMBULATORY_SURGERY_CENTER): Payer: Self-pay

## 2016-08-15 VITALS — Ht 68.5 in | Wt 168.8 lb

## 2016-08-15 DIAGNOSIS — Z86010 Personal history of colonic polyps: Secondary | ICD-10-CM

## 2016-08-15 MED ORDER — NA SULFATE-K SULFATE-MG SULF 17.5-3.13-1.6 GM/177ML PO SOLN: 1.0000 | Freq: Once | ORAL | 0 refills | Status: AC

## 2016-08-15 NOTE — Progress Notes (Signed)
Denies allergies to eggs or soy products. Denies complication of anesthesia or sedation. Denies use of weight loss medication. Denies use of O2.   Emmi instructions declined. Patient has had many colonoscopies.    When reviewing Suprep instructions, patient states that she is going to prep her own way and not follow instructions. Patient states that her body does not work like other people. Patient states that she discussed with Dr. Russella DarStark. I reiterated what we suggest and the importance of following the prep with two 16 oz of water. Patient was determined to do it her way.

## 2016-08-27 ENCOUNTER — Encounter: Payer: Federal, State, Local not specified - PPO | Admitting: Gastroenterology

## 2016-09-04 ENCOUNTER — Encounter: Payer: Self-pay | Admitting: Gastroenterology

## 2016-09-10 ENCOUNTER — Ambulatory Visit (AMBULATORY_SURGERY_CENTER): Payer: Federal, State, Local not specified - PPO | Admitting: Gastroenterology

## 2016-09-10 ENCOUNTER — Encounter: Payer: Self-pay | Admitting: Gastroenterology

## 2016-09-10 VITALS — BP 134/61 | HR 75 | Temp 98.9°F | Resp 16 | Ht 68.5 in | Wt 168.0 lb

## 2016-09-10 DIAGNOSIS — Z860100 Personal history of colon polyps, unspecified: Secondary | ICD-10-CM

## 2016-09-10 DIAGNOSIS — Z800 Family history of malignant neoplasm of digestive organs: Secondary | ICD-10-CM | POA: Diagnosis not present

## 2016-09-10 MED ORDER — SODIUM CHLORIDE 0.9 % IV SOLN: 500.0000 mL | INTRAVENOUS | Status: AC

## 2016-09-10 NOTE — Progress Notes (Signed)
Report given to PACU, vss 

## 2016-09-10 NOTE — Patient Instructions (Signed)

## 2016-09-10 NOTE — Op Note (Signed)
[facility] Patient Name: Crystal Murray Procedure Date: 09/10/2016 7:55 AM MRN: [id number] Endoscopist: Meryl Dare , MD Age: 66 Referring MD:  Date of Birth: May 06, 1950 Gender: Female Account #: [id number] Procedure:                Colonoscopy Indications:              Surveillance: Personal history of adenomatous                            polyps on last colonoscopy > 5 years ago. Family                            history of colon cancer. Medicines:                Monitored Anesthesia Care Procedure:                Pre-Anesthesia Assessment:                           - Prior to the procedure, a History and Physical                            was performed, and patient medications and                            allergies were reviewed. The patient's tolerance of                            previous anesthesia was also reviewed. The risks                            and benefits of the procedure and the sedation                            options and risks were discussed with the patient.                            All questions were answered, and informed consent                            was obtained. Prior Anticoagulants: The patient has                            taken no previous anticoagulant or antiplatelet                            agents. ASA Grade Assessment: III - A patient with                            severe systemic disease. After reviewing the risks                            and benefits, the patient was deemed in  satisfactory condition to undergo the procedure.                           After obtaining informed consent, the colonoscope                            was passed under direct vision. Throughout the                            procedure, the patient's blood pressure, pulse, and                            oxygen saturations were monitored continuously. The                            Colonoscope was introduced through  the anus and                            advanced to the the cecum, identified by                            appendiceal orifice and ileocecal valve. The                            ileocecal valve, appendiceal orifice, and rectum                            were photographed. The quality of the bowel                            preparation was good. The patient tolerated the                            procedure well. The colonoscopy was somewhat                            difficult due to a tortuous colon. Successful                            completion of the procedure was aided by using                            manual pressure, withdrawing and reinserting the                            scope and straightening and shortening the scope to                            obtain bowel loop reduction. Scope In: 8:18:58 AM Scope Out: 8:34:16 AM Scope Withdrawal Time: 0 hours 8 minutes 29 seconds  Total Procedure Duration: 0 hours 15 minutes 18 seconds  Findings:                 The perianal and digital rectal examinations were  normal.                           The entire examined colon appeared normal on direct                            and retroflexion views. Complications:            No immediate complications. Estimated blood loss:                            None. Estimated Blood Loss:     Estimated blood loss: none. Impression:               - The entire examined colon is normal on direct and                            retroflexion views.                           - No specimens collected. Recommendation:           - Repeat colonoscopy in 5 years for surveillance.                           - Patient has a contact number available for                            emergencies. The signs and symptoms of potential                            delayed complications were discussed with the                            patient. Return to normal activities tomorrow.                             Written discharge instructions were provided to the                            patient.                           - Resume previous diet.                           - Continue present medications. Meryl DareMalcolm T Migel Hannis, MD 09/10/2016 8:40:52 AM This report has been signed electronically.

## 2016-09-11 ENCOUNTER — Telehealth: Payer: Self-pay | Admitting: *Deleted

## 2016-09-11 ENCOUNTER — Telehealth: Payer: Self-pay

## 2016-09-11 NOTE — Telephone Encounter (Signed)
  Follow up Call-  Call back number 09/10/2016  Post procedure Call Back phone  # [phone]  Permission to leave phone message Yes  Some recent data might be hidden     Patient questions:  Do you have a fever, pain , or abdominal swelling? No. Pain Score  0 *  Have you tolerated food without any problems? Yes.    Have you been able to return to your normal activities? Yes.    Do you have any questions about your discharge instructions: Diet   No. Medications  No. Follow up visit  No.  Do you have questions or concerns about your Care? No.  Actions: * If pain score is 4 or above: No action needed, pain <4.

## 2016-09-11 NOTE — Telephone Encounter (Signed)
  Follow up Call-  Call back number 09/10/2016  Post procedure Call Back phone  # [phone]  Permission to leave phone message Yes  Some recent data might be hidden     Left message

## 2016-09-20 ENCOUNTER — Telehealth: Payer: Self-pay | Admitting: Gastroenterology

## 2016-09-20 NOTE — Telephone Encounter (Signed)
Patient has questions about her colonoscopy report, she wanted to talk to a provider, she didn't have the specific question ready today. She is still having left sided abdominal pain. She wanted a sooner appointment with Dr. Russella DarStark, first available is Oct. she did accept appointment with APP for next week.

## 2016-09-26 ENCOUNTER — Ambulatory Visit (INDEPENDENT_AMBULATORY_CARE_PROVIDER_SITE_OTHER): Payer: Federal, State, Local not specified - PPO | Admitting: Gastroenterology

## 2016-09-26 ENCOUNTER — Encounter: Payer: Self-pay | Admitting: Gastroenterology

## 2016-09-26 VITALS — BP 120/68 | HR 64 | Ht 67.0 in | Wt 164.3 lb

## 2016-09-26 DIAGNOSIS — R1013 Epigastric pain: Secondary | ICD-10-CM

## 2016-09-26 DIAGNOSIS — K219 Gastro-esophageal reflux disease without esophagitis: Secondary | ICD-10-CM

## 2016-09-26 DIAGNOSIS — G8929 Other chronic pain: Secondary | ICD-10-CM

## 2016-09-26 MED ORDER — PANTOPRAZOLE SODIUM 40 MG PO TBEC: 40.0000 mg | DELAYED_RELEASE_TABLET | Freq: Two times a day (BID) | ORAL | 5 refills | Status: AC

## 2016-09-26 MED ORDER — PANTOPRAZOLE SODIUM 40 MG PO TBEC: 40.0000 mg | DELAYED_RELEASE_TABLET | Freq: Two times a day (BID) | ORAL | 5 refills | Status: DC

## 2016-09-26 NOTE — Patient Instructions (Signed)
Discontinue the Prilosec.  We sent a prescription for Pantoprazole Sodium 40 mg, twice daily to the pharmacy.  We will call you with an appointment to follow up with Doug SouJessica Zehr PA-C  In 4-6 weeks.

## 2016-09-26 NOTE — Progress Notes (Signed)
09/26/2016 Crystal Murray [id number] Aug 21, 1950   HISTORY OF PRESENT ILLNESS:  This is a pleasant 66 year old female who is known to Dr. Russella Dar for recent colonoscopy.  She actually presents to our office today with upper GI complaints. She complains of ongoing epigastric abdominal pain that seems to come and go with eating. She feels discomfort in her chest as well at times with sensation that "food is backing up", but no sensation of food actually getting stuck.  She feels that ever since she had to drink colonoscopy prep it has really flared things up. She admits that she is under a lot of stress. She is currently taking Prilosec 40 mg daily and has had to add in Zantac and Tums as well. She has been on the prilosec for several years.  She had an EGD in June 2012 at a different facility at which time the study was normal.  That EGD was performed for complaints of epigastric pain and nausea.  Rare NSAID use.   Past Medical History:  Diagnosis Date  . Allergy   . Alopecia   . Anxiety   . Bright red rectal bleeding    Painful; anal fissure suspected (although not seen on exam by her GYN specialist, Dr. Ashley Royalty).  . Dry eyes   . Gastritis EGD 07/2013   H pylori neg (mild chronic gastritis)  . GERD (gastroesophageal reflux disease)   . Heart murmur   . Herpes zoster 1976  . History of endometriosis   . History of vertigo   . Hypertension   . IBS (irritable bowel syndrome)    Constipation-predominant  . Migraine syndrome    Tylenol x 2 onset, then tylenol #4 if no help, then triptan if no help.  Marland Kitchen OAB (overactive bladder)    Saw specialist, Dr. Ashley Royalty, 12/2014 and had her oxybutinyn increased and will start biofeedback.  . Osteoarthritis of both knees    "bone on bone" per pt--got TKA on left 09/01/14 (Dr. Dion Saucier).  For right knee she is going to get hyaluronic acid injections as of 03/2016.  . Osteoporosis   . Palpitations    Echo/Holter monitoring; cardiology eval 2015 all  normal.  . Primary localized osteoarthritis of left knee 08/30/2014   Left TKA 08/30/14 (Dr. Dion Saucier)  . Recurrent herpes labialis   . Restless legs syndrome   . Seasonal allergic rhinitis   . Varicose veins    Past Surgical History:  Procedure Laterality Date  . ABDOMINAL HYSTERECTOMY  1980s   endometriosis  . APPENDECTOMY  1983  . bladder tack  2010   vaginally, with mesh  . CHOLECYSTECTOMY, LAPAROSCOPIC  2014  . COLONOSCOPY  1989, 1992; 1997; q 5 yrs   Most recent 07/2010, hyperplastic polyp---recommended repeat 5-7 yrs  . ESOPHAGOGASTRODUODENOSCOPY  07/2013   gastritis; prilosec started  . Ovaries and tubes removed  1980s   a few years after uterus removed.  Marland Kitchen POLYPECTOMY    . ROBOTIC ASSISTED LAPAROSCOPIC SACROCOLPOPEXY  05/2013  . TONSILLECTOMY AND ADENOIDECTOMY  1966  . TOTAL KNEE ARTHROPLASTY Left 08/30/2014   Procedure: TOTAL KNEE ARTHROPLASTY;  Surgeon: Teryl Lucy, MD;  Location: [facility] OR;  Service: Orthopedics;  Laterality: Left;    reports that she has never smoked. She has never used smokeless tobacco. She reports that she does not drink alcohol or use drugs. family history includes CAD (age of onset: 34) in her unknown relative; CVA (age of onset: 57) in her father; Colon cancer (age of  onset: 53) in her mother; Heart disease in her father. Allergies  Allergen Reactions  . Amitiza [Lubiprostone] Hives and Shortness Of Breath  . Biaxin [Clarithromycin] Hives and Shortness Of Breath  . Iodine Swelling    Mouth and throat swelling  . Penicillins Hives and Shortness Of Breath  . Sulfa Antibiotics Hives and Shortness Of Breath  . Versed [Midazolam]   . Zithromax [Azithromycin] Hives and Shortness Of Breath  . Brimonidine Tartrate Itching and Swelling  . Lisinopril     Hair loss  . Prednisone Nausea Only    Hot flash, dizziness  . Adhesive [Tape] Rash  . Cefdinir Itching and Rash  . Levaquin [Levofloxacin In D5w] Itching and Rash      Outpatient Encounter  Prescriptions as of 09/26/2016  Medication Sig  . acetaminophen-codeine (TYLENOL #4) 300-60 MG tablet Take 1 tablet by mouth every 4 (four) hours as needed for moderate pain.  Marland Kitchen ALPRAZolam (XANAX) 0.25 MG tablet Take 1 tablet (0.25 mg total) by mouth at bedtime.  Marland Kitchen aspirin 81 MG tablet Take 81 mg by mouth daily.  . Biotin 5000 MCG CAPS Take 5,000 mcg by mouth daily.   . Calcium Carbonate-Vitamin D (CALCIUM + D PO) Take 1 tablet by mouth 2 (two) times daily.   . cetirizine (ZYRTEC) 10 MG tablet Take 10 mg by mouth daily as needed.   Marland Kitchen losartan (COZAAR) 100 MG tablet Take 1 tablet (100 mg total) by mouth daily.  . meclizine (ANTIVERT) 25 MG tablet Take 25 mg by mouth as needed for dizziness.  . Multiple Vitamin (MULTIVITAMIN) tablet Take 1 tablet by mouth daily.  . naproxen sodium (ANAPROX) 220 MG tablet Take 220 mg by mouth 2 (two) times daily as needed.   Marland Kitchen omeprazole (PRILOSEC) 40 MG capsule Take 1 capsule (40 mg total) by mouth daily.  Marland Kitchen oxybutynin (DITROPAN XL) 15 MG 24 hr tablet Take 1 tablet (15 mg total) by mouth daily.  Bertram Gala Glycol-Propyl Glycol 0.4-0.3 % SOLN Place 1 drop into both eyes 3 (three) times daily as needed (dry eyes). 1 Drop Each Eye 2 To 3 Times A Day  . rOPINIRole (REQUIP) 1 MG tablet TAKE 1 TABLET BY MOUTH EVERY MORNING AND EVERY EVENING  . triamcinolone (NASACORT ALLERGY 24HR) 55 MCG/ACT AERO nasal inhaler Place 2 sprays into the nose daily as needed.   . vitamin E 400 UNIT capsule Take 400 Units by mouth as directed. 4 times weekly   Facility-Administered Encounter Medications as of 09/26/2016  Medication  . 0.9 %  sodium chloride infusion    REVIEW OF SYSTEMS  : All other systems reviewed and negative except where noted in the History of Present Illness.   PHYSICAL EXAM: BP 120/68   Pulse 64   Ht 5\' 7"  (1.702 m)   Wt 164 lb 4.8 oz (74.5 kg)   BMI 25.73 kg/m  General: Well developed white female in no acute distress Head: Normocephalic and  atraumatic Eyes:  Sclerae anicteric, conjunctiva pink. Ears: Normal auditory acuity Lungs: Clear throughout to auscultation; no increased WOB. Heart: Regular rate and rhythm Abdomen: Soft, non-distended.  BS present.  Mild epigastric TTP. Musculoskeletal: Symmetrical with no gross deformities  Skin: No lesions on visible extremities Extremities: No edema  Neurological: Alert oriented x 4, grossly non-focal Psychological:  Alert and cooperative. Normal mood and affect  ASSESSMENT AND PLAN: -GERD, epigastric abdominal pain, chest discomfort with eating:  I think that we will discontinue her prilosec and have her start pantoprazole  40 mg BID for now.  Can also still use Zantac at bedtime if needed.  Will have her follow-up in 4-6 weeks and if still having significant symptoms then will discuss EGD.  CC:  McGowen, Maryjean MornPhilip H, MD

## 2016-10-01 DIAGNOSIS — K219 Gastro-esophageal reflux disease without esophagitis: Secondary | ICD-10-CM | POA: Insufficient documentation

## 2016-10-01 DIAGNOSIS — R1013 Epigastric pain: Secondary | ICD-10-CM | POA: Insufficient documentation

## 2016-10-01 NOTE — Progress Notes (Signed)
Reviewed and agree with initial management plan.  Renee Beale T. Imer Foxworth, MD FACG 

## 2016-10-11 ENCOUNTER — Encounter: Payer: Self-pay | Admitting: Gastroenterology

## 2016-10-27 ENCOUNTER — Encounter: Payer: Self-pay | Admitting: Family Medicine

## 2016-11-07 ENCOUNTER — Ambulatory Visit (INDEPENDENT_AMBULATORY_CARE_PROVIDER_SITE_OTHER): Payer: Federal, State, Local not specified - PPO | Admitting: Orthopaedic Surgery

## 2016-11-07 DIAGNOSIS — M1711 Unilateral primary osteoarthritis, right knee: Secondary | ICD-10-CM

## 2016-11-07 DIAGNOSIS — G8929 Other chronic pain: Secondary | ICD-10-CM | POA: Diagnosis not present

## 2016-11-07 DIAGNOSIS — M25561 Pain in right knee: Secondary | ICD-10-CM

## 2016-11-07 MED ORDER — METHYLPREDNISOLONE ACETATE 40 MG/ML IJ SUSP
40.0000 mg | INTRAMUSCULAR | Status: AC | PRN
  Administered 2016-11-07: 40 mg via INTRA_ARTICULAR

## 2016-11-07 MED ORDER — LIDOCAINE HCL 1 % IJ SOLN
3.0000 mL | INTRAMUSCULAR | Status: AC | PRN
  Administered 2016-11-07: 3 mL

## 2016-11-07 NOTE — Progress Notes (Signed)
Office Visit Note   Patient: Crystal Murray           Date of Birth: Jul 22, 1950           MRN: [id number] Visit Date: 11/07/2016              Requested by: Jeoffrey Massed, MD [address] [place], [place] [address] PCP: Jeoffrey Massed, MD   Assessment & Plan: Visit Diagnoses:  1. Unilateral primary osteoarthritis, right knee   2. Chronic pain of right knee     Plan:She will follow-up as needed. However when he gets close to the time that she needs a hyaluronic acid injection she will call so we can order this. She has known osteoporosis that knee and does not want a knee replacement yet due to the fact that steroid injections and intermittent hyaluronic injections of helped her quite a bit and help her maintain a good quality of life and mobility.  Follow-Up Instructions: Return if symptoms worsen or fail to improve.   Orders:  No orders of the defined types were placed in this encounter.  No orders of the defined types were placed in this encounter.     Procedures: Large Joint Inj Date/Time: 11/07/2016 3:38 PM Performed by: Kathryne Hitch Authorized by: Kathryne Hitch   Location:  Knee Site:  R knee Ultrasound Guidance: No   Fluoroscopic Guidance: No   Arthrogram: No   Medications:  3 mL lidocaine 1 %; 40 mg methylPREDNISolone acetate 40 MG/ML     Clinical Data: No additional findings.   Subjective: No chief complaint on file. The patient is well-known to me. She has chronic pain in her right knee from known osteoarthritis of that knee. She comes in about every 3-4 months for steroid injection in her knee. She like to have this again today. Nothing else is changed acutely in her medical status.  HPI  Review of Systems He currently denies any headache, chest pain, shortness of breath, fever, chills, nausea, vomiting.  Objective: Vital Signs: There were no vitals taken for this visit.  Physical Exam His alert and oriented 3 in  no acute distress Ortho Exam  Specialty Comments:  No specialty comments available.  Imaging: No results found.   PMFS History: Patient Active Problem List   Diagnosis Date Noted  . Abdominal pain, chronic, epigastric 10/01/2016  . Gastroesophageal reflux disease 10/01/2016  . Hematuria 12/12/2014  . Dysuria 12/12/2014  . Unilateral primary osteoarthritis, right knee 08/30/2014  . Osteoarthritis of both knees   . Essential hypertension 02/23/2014  . Urge incontinence 02/23/2014  . Restless legs syndrome 02/23/2014  . Preventative health care 02/23/2014   Past Medical History:  Diagnosis Date  . Allergy   . Alopecia   . Anxiety   . Bright red rectal bleeding    Painful; anal fissure suspected (although not seen on exam by her GYN specialist, Dr. Ashley Royalty).  . Dry eyes   . Family history of colon cancer   . Gastritis EGD 07/2013   H pylori neg (mild chronic gastritis)  . GERD (gastroesophageal reflux disease)   . Heart murmur   . Herpes zoster 1976  . History of adenomatous polyp of colon    Most recent colonosocpy 08/2016--no polyps.  Recall 5 yrs  . History of endometriosis   . History of vertigo   . Hypertension   . IBS (irritable bowel syndrome)    Constipation-predominant  . Migraine syndrome  Tylenol x 2 onset, then tylenol #4 if no help, then triptan if no help.  Marland Kitchen OAB (overactive bladder)    Saw specialist, Dr. Ashley Royalty, 12/2014 and had her oxybutinyn increased and will start biofeedback.  . Osteoarthritis of both knees    "bone on bone" per pt--got TKA on left 09/01/14 (Dr. Dion Saucier).  For right knee she is going to get hyaluronic acid injections as of 03/2016.  . Osteoporosis   . Palpitations    Echo/Holter monitoring; cardiology eval 2015 all normal.  . Primary localized osteoarthritis of left knee 08/30/2014   Left TKA 08/30/14 (Dr. Dion Saucier)  . Recurrent herpes labialis   . Restless legs syndrome   . Seasonal allergic rhinitis   . Varicose veins       Family History  Problem Relation Age of Onset  . Colon cancer Mother 5       ? blood clot? per ortho notes--need to clarify with pt  . CVA Father 56  . Heart disease Father   . CAD Unknown 70  . Esophageal cancer Neg Hx   . Rectal cancer Neg Hx   . Stomach cancer Neg Hx     Past Surgical History:  Procedure Laterality Date  . ABDOMINAL HYSTERECTOMY  1980s   endometriosis  . APPENDECTOMY  1983  . bladder tack  2010   vaginally, with mesh  . CHOLECYSTECTOMY, LAPAROSCOPIC  2014  . COLONOSCOPY  1989, 1992; 1997; 2018 -q 5 yrs   Most recent 08/2016, no polyps---recommended repeat 5 yr  . ESOPHAGOGASTRODUODENOSCOPY  07/2013   gastritis; prilosec started  . Ovaries and tubes removed  1980s   a few years after uterus removed.  Marland Kitchen POLYPECTOMY    . ROBOTIC ASSISTED LAPAROSCOPIC SACROCOLPOPEXY  05/2013  . TONSILLECTOMY AND ADENOIDECTOMY  1966  . TOTAL KNEE ARTHROPLASTY Left 08/30/2014   Procedure: TOTAL KNEE ARTHROPLASTY;  Surgeon: Teryl Lucy, MD;  Location: [facility] OR;  Service: Orthopedics;  Laterality: Left;   Social History   Occupational History  . Not on file.   Social History Main Topics  . Smoking status: Never Smoker  . Smokeless tobacco: Never Used  . Alcohol use No  . Drug use: No  . Sexual activity: Not on file

## 2016-11-11 ENCOUNTER — Encounter: Payer: Self-pay | Admitting: Gastroenterology

## 2016-11-11 ENCOUNTER — Ambulatory Visit (INDEPENDENT_AMBULATORY_CARE_PROVIDER_SITE_OTHER): Payer: Federal, State, Local not specified - PPO | Admitting: Gastroenterology

## 2016-11-11 VITALS — BP 108/62 | HR 60 | Ht 67.5 in | Wt 162.0 lb

## 2016-11-11 DIAGNOSIS — R1013 Epigastric pain: Secondary | ICD-10-CM | POA: Diagnosis not present

## 2016-11-11 DIAGNOSIS — K219 Gastro-esophageal reflux disease without esophagitis: Secondary | ICD-10-CM | POA: Diagnosis not present

## 2016-11-11 NOTE — Progress Notes (Signed)
11/11/2016 Crystal Murray [id number] 01-08-1951   HISTORY OF PRESENT ILLNESS:  Crystal Murray is a pleasant 66 year old female who is here for follow-up of her reflux and epigastric abdominal pain. She was seen by me back in August, about 6 weeks ago, for these complaints. She had been on Prilosec for a long time so we decided to change her medication and increase it to twice daily. She was prescribed pantoprazole 40 mg twice a day. She says that she has been doing much better with that regimen. She admits that she is not 100% better, but is improved significantly enough that she does not feel like she needs another endoscopy at this time. She reports that she actually had one in [place] more recent than we have in our system. We have her last as being in 2012 and was normal at that time. She tells me that she found records at home showing that she had one in 2015 that only showed some gastritis. She had an unremarkable CT scan earlier this year as well. She admits that she did eat a salad the other day and that seemed to really bother her stomach and cause her some upper abdominal pain, but otherwise she is not had any major issues.   Past Medical History:  Diagnosis Date  . Allergy   . Alopecia   . Anxiety   . Bright red rectal bleeding    Painful; anal fissure suspected (although not seen on exam by her GYN specialist, Dr. Ashley Royalty).  . Dry eyes   . Family history of colon cancer   . Gastritis EGD 07/2013   H pylori neg (mild chronic gastritis)  . GERD (gastroesophageal reflux disease)   . Heart murmur   . Herpes zoster 1976  . History of adenomatous polyp of colon    Most recent colonosocpy 08/2016--no polyps.  Recall 5 yrs  . History of endometriosis   . History of vertigo   . Hypertension   . IBS (irritable bowel syndrome)    Constipation-predominant  . Migraine syndrome    Tylenol x 2 onset, then tylenol #4 if no help, then triptan if no help.  Marland Kitchen OAB (overactive bladder)    Saw  specialist, Dr. Ashley Royalty, 12/2014 and had her oxybutinyn increased and will start biofeedback.  . Osteoarthritis of both knees    "bone on bone" per pt--got TKA on left 09/01/14 (Dr. Dion Saucier).  For right knee she is going to get hyaluronic acid injections as of 03/2016.  . Osteoporosis   . Palpitations    Echo/Holter monitoring; cardiology eval 2015 all normal.  . Primary localized osteoarthritis of left knee 08/30/2014   Left TKA 08/30/14 (Dr. Dion Saucier)  . Recurrent herpes labialis   . Restless legs syndrome   . Seasonal allergic rhinitis   . Varicose veins    Past Surgical History:  Procedure Laterality Date  . ABDOMINAL HYSTERECTOMY  1980s   endometriosis  . APPENDECTOMY  1983  . bladder tack  2010   vaginally, with mesh  . CHOLECYSTECTOMY, LAPAROSCOPIC  2014  . COLONOSCOPY  1989, 1992; 1997; 2018 -q 5 yrs   Most recent 08/2016, no polyps---recommended repeat 5 yr  . ESOPHAGOGASTRODUODENOSCOPY  07/2013   gastritis; prilosec started  . Ovaries and tubes removed  1980s   a few years after uterus removed.  Marland Kitchen POLYPECTOMY    . ROBOTIC ASSISTED LAPAROSCOPIC SACROCOLPOPEXY  05/2013  . TONSILLECTOMY AND ADENOIDECTOMY  1966  . TOTAL KNEE ARTHROPLASTY Left 08/30/2014  Procedure: TOTAL KNEE ARTHROPLASTY;  Surgeon: Teryl Lucy, MD;  Location: [facility];  Service: Orthopedics;  Laterality: Left;    reports that she has never smoked. She has never used smokeless tobacco. She reports that she does not drink alcohol or use drugs. family history includes CAD (age of onset: 38) in her unknown relative; CVA (age of onset: 45) in her father; Colon cancer (age of onset: 35) in her mother; Heart disease in her father. Allergies  Allergen Reactions  . Amitiza [Lubiprostone] Hives and Shortness Of Breath  . Biaxin [Clarithromycin] Hives and Shortness Of Breath  . Iodine Swelling    Mouth and throat swelling  . Penicillins Hives and Shortness Of Breath  . Sulfa Antibiotics Hives and Shortness Of Breath  .  Versed [Midazolam]   . Zithromax [Azithromycin] Hives and Shortness Of Breath  . Brimonidine Tartrate Itching and Swelling  . Lisinopril     Hair loss  . Prednisone Nausea Only    Hot flash, dizziness  . Adhesive [Tape] Rash  . Cefdinir Itching and Rash  . Levaquin [Levofloxacin In D5w] Itching and Rash      Outpatient Encounter Prescriptions as of 11/11/2016  Medication Sig  . acetaminophen-codeine (TYLENOL #4) 300-60 MG tablet Take 1 tablet by mouth every 4 (four) hours as needed for moderate pain.  Marland Kitchen ALPRAZolam (XANAX) 0.25 MG tablet Take 1 tablet (0.25 mg total) by mouth at bedtime.  Marland Kitchen aspirin 81 MG tablet Take 81 mg by mouth daily.  . Biotin 5000 MCG CAPS Take 5,000 mcg by mouth daily.   . Calcium Carbonate-Vitamin D (CALCIUM + D PO) Take 1 tablet by mouth 2 (two) times daily.   . cetirizine (ZYRTEC) 10 MG tablet Take 10 mg by mouth daily as needed.   Marland Kitchen losartan (COZAAR) 100 MG tablet Take 1 tablet (100 mg total) by mouth daily.  . meclizine (ANTIVERT) 25 MG tablet Take 25 mg by mouth as needed for dizziness.  . Multiple Vitamin (MULTIVITAMIN) tablet Take 1 tablet by mouth daily.  . naproxen sodium (ANAPROX) 220 MG tablet Take 220 mg by mouth 2 (two) times daily as needed.   Marland Kitchen oxybutynin (DITROPAN XL) 15 MG 24 hr tablet Take 1 tablet (15 mg total) by mouth daily.  . pantoprazole (PROTONIX) 40 MG tablet Take 1 tablet (40 mg total) by mouth 2 (two) times daily.  Bertram Gala Glycol-Propyl Glycol 0.4-0.3 % SOLN Place 1 drop into both eyes 3 (three) times daily as needed (dry eyes). 1 Drop Each Eye 2 To 3 Times A Day  . rOPINIRole (REQUIP) 1 MG tablet TAKE 1 TABLET BY MOUTH EVERY MORNING AND EVERY EVENING  . triamcinolone (NASACORT ALLERGY 24HR) 55 MCG/ACT AERO nasal inhaler Place 2 sprays into the nose daily as needed.   . vitamin E 400 UNIT capsule Take 400 Units by mouth as directed. 4 times weekly  . [DISCONTINUED] omeprazole (PRILOSEC) 40 MG capsule Take 1 capsule (40 mg total) by  mouth daily.   Facility-Administered Encounter Medications as of 11/11/2016  Medication  . 0.9 %  sodium chloride infusion     REVIEW OF SYSTEMS  : All other systems reviewed and negative except where noted in the History of Present Illness.   PHYSICAL EXAM: BP 108/62   Pulse 60   Ht 5' 7.5" (1.715 m)   Wt 162 lb (73.5 kg)   BMI 25.00 kg/m  General: Well developed white female in no acute distress Head: Normocephalic and atraumatic Eyes:  Sclerae anicteric,  conjunctiva pink. Ears: Normal auditory acuity Lungs: Clear throughout to auscultation; no increased WOB. Heart: Regular rate and rhythm; no M/R/G. Abdomen: Soft, non-distended.  BS present.  Minimal LUQ TTP. Musculoskeletal: Symmetrical with no gross deformities  Skin: No lesions on visible extremities Extremities: No edema  Neurological: Alert oriented x 4, grossly non-focal Psychological:  Alert and cooperative. Normal mood and affect  ASSESSMENT AND PLAN: -GERD and upper abdominal pain:  Has significantly improved with change of PPI and increase to twice daily. She feels much better at this point, although not 100%. She does not feel she needs repeat EGD at this time. She will continue twice daily PPI for another 6 weeks and if doing well then can decrease back to once a day. She will follow up here as needed for any worsening or ongoing/recurrent symptoms.   CC:  McGowen, Maryjean Morn, MD

## 2016-11-11 NOTE — Patient Instructions (Addendum)
Continue the Pantoprazole Sodium twice daily for 6 weeks, then go to once daily.  Call us and we can send a new script for once daily.   Follow up as needed.

## 2016-11-13 NOTE — Progress Notes (Signed)
Reviewed and agree with initial management plan.  Arvis Miguez T. Akil Hoos, MD FACG 

## 2016-12-02 ENCOUNTER — Telehealth (INDEPENDENT_AMBULATORY_CARE_PROVIDER_SITE_OTHER): Payer: Self-pay | Admitting: Orthopaedic Surgery

## 2016-12-02 NOTE — Telephone Encounter (Signed)
Pt request auth for Gel injection sent to her insurance company.

## 2016-12-04 ENCOUNTER — Other Ambulatory Visit (INDEPENDENT_AMBULATORY_CARE_PROVIDER_SITE_OTHER): Payer: Self-pay

## 2016-12-04 NOTE — Telephone Encounter (Signed)
Put PA order in for her Synvisc

## 2016-12-13 ENCOUNTER — Ambulatory Visit (INDEPENDENT_AMBULATORY_CARE_PROVIDER_SITE_OTHER): Payer: Federal, State, Local not specified - PPO | Admitting: Family Medicine

## 2016-12-13 ENCOUNTER — Encounter: Payer: Self-pay | Admitting: Family Medicine

## 2016-12-13 VITALS — BP 144/68 | HR 65 | Temp 98.1°F | Resp 16 | Ht 67.5 in | Wt 106.2 lb

## 2016-12-13 DIAGNOSIS — Z23 Encounter for immunization: Secondary | ICD-10-CM

## 2016-12-13 DIAGNOSIS — G43909 Migraine, unspecified, not intractable, without status migrainosus: Secondary | ICD-10-CM | POA: Diagnosis not present

## 2016-12-13 MED ORDER — ACETAMINOPHEN-CODEINE #4 300-60 MG PO TABS: 1.0000 | ORAL_TABLET | ORAL | 0 refills | Status: AC | PRN

## 2016-12-13 NOTE — Addendum Note (Signed)
Addended by: Smitty KnudsenSUTHERLAND, Byrant Valent K on: 12/13/2016 10:01 AM   Modules accepted: Orders

## 2016-12-13 NOTE — Patient Instructions (Signed)
Best of luck to you in [place]!

## 2016-12-13 NOTE — Progress Notes (Signed)
OFFICE VISIT  12/13/2016   CC:  Chief Complaint  Patient presents with  . Follow-up    Migraine  . Immunizations    Prevnar 13 and Flu    HPI:    Patient is a 66 y.o. Caucasian female who presents for f/u migraine syndrome. Of note, she will be moving to [place], so this will be her last visit with me. She needs prevnar 13 and flu vaccine today.  Lots of stresses with taking care of moving, home selling/buying, taking care of chronically ill husband. Sleeping poorly.  Has been having more migraines lately--has 30 yr hx of migraine syndrome: typical HA for her is terrible pounding in bilat frontal region, gets flashing spots in visual fields during HA at times.  No nausea or photophobia with HAs the last few years.  Occ gets dizzy as a prodrome, uses meclezine for this.  She has tried a few acute migraine abortive meds that she cannot recall the names of in the past.  Yesterday took tylenol, then took a tylenol 4 and this helps. Tylenol 4, 1 pill always helps.    Frequency of the migraines is 2-3 times per month lately.  Stress main trigger.   Past Medical History:  Diagnosis Date  . Allergy   . Alopecia   . Anxiety   . Bright red rectal bleeding    Painful; anal fissure suspected (although not seen on exam by her GYN specialist, Dr. Ashley Royalty).  . Dry eyes   . Family history of colon cancer   . Gastritis EGD 07/2013   H pylori neg (mild chronic gastritis)  . GERD (gastroesophageal reflux disease)   . Heart murmur   . Herpes zoster 1976  . History of adenomatous polyp of colon    Most recent colonosocpy 08/2016--no polyps.  Recall 5 yrs  . History of endometriosis   . History of vertigo   . Hypertension   . IBS (irritable bowel syndrome)    Constipation-predominant  . Migraine syndrome    Tylenol x 2 onset, then tylenol #4 if no help, then triptan if no help.  Marland Kitchen OAB (overactive bladder)    Saw specialist, Dr. Ashley Royalty, 12/2014 and had her oxybutinyn increased and will  start biofeedback.  . Osteoarthritis of both knees    "bone on bone" per pt--got TKA on left 09/01/14 (Dr. Dion Saucier).  For right knee she is going to get hyaluronic acid injections as of 03/2016.  . Osteoporosis   . Palpitations    Echo/Holter monitoring; cardiology eval 2015 all normal.  . Primary localized osteoarthritis of left knee 08/30/2014   Left TKA 08/30/14 (Dr. Dion Saucier)  . Recurrent herpes labialis   . Restless legs syndrome   . Seasonal allergic rhinitis   . Varicose veins     Past Surgical History:  Procedure Laterality Date  . ABDOMINAL HYSTERECTOMY  1980s   endometriosis  . APPENDECTOMY  1983  . bladder tack  2010   vaginally, with mesh  . CHOLECYSTECTOMY, LAPAROSCOPIC  2014  . COLONOSCOPY  1989, 1992; 1997; 2018 -q 5 yrs   Most recent 08/2016, no polyps---recommended repeat 5 yr  . ESOPHAGOGASTRODUODENOSCOPY  07/2013   gastritis; prilosec started  . Ovaries and tubes removed  1980s   a few years after uterus removed.  Marland Kitchen POLYPECTOMY    . ROBOTIC ASSISTED LAPAROSCOPIC SACROCOLPOPEXY  05/2013  . TONSILLECTOMY AND ADENOIDECTOMY  1966  . TOTAL KNEE ARTHROPLASTY Left 08/30/2014   Procedure: TOTAL KNEE ARTHROPLASTY;  Surgeon: Teryl Lucy,  MD;  Location: [facility] OR;  Service: Orthopedics;  Laterality: Left;    Outpatient Medications Prior to Visit  Medication Sig Dispense Refill  . ALPRAZolam (XANAX) 0.25 MG tablet Take 1 tablet (0.25 mg total) by mouth at bedtime. 30 tablet 5  . aspirin 81 MG tablet Take 81 mg by mouth daily.    . Biotin 5000 MCG CAPS Take 5,000 mcg by mouth daily.     . Calcium Carbonate-Vitamin D (CALCIUM + D PO) Take 1 tablet by mouth 2 (two) times daily.     . cetirizine (ZYRTEC) 10 MG tablet Take 10 mg by mouth daily as needed.     Marland Kitchen. losartan (COZAAR) 100 MG tablet Take 1 tablet (100 mg total) by mouth daily. 90 tablet 3  . meclizine (ANTIVERT) 25 MG tablet Take 25 mg by mouth as needed for dizziness.    . Multiple Vitamin (MULTIVITAMIN) tablet Take 1  tablet by mouth daily.    . naproxen sodium (ANAPROX) 220 MG tablet Take 220 mg by mouth 2 (two) times daily as needed.     Marland Kitchen. oxybutynin (DITROPAN XL) 15 MG 24 hr tablet Take 1 tablet (15 mg total) by mouth daily. 90 tablet 3  . pantoprazole (PROTONIX) 40 MG tablet Take 1 tablet (40 mg total) by mouth 2 (two) times daily. 60 tablet 5  . Polyethyl Glycol-Propyl Glycol 0.4-0.3 % SOLN Place 1 drop into both eyes 3 (three) times daily as needed (dry eyes). 1 Drop Each Eye 2 To 3 Times A Day    . rOPINIRole (REQUIP) 1 MG tablet TAKE 1 TABLET BY MOUTH EVERY MORNING AND EVERY EVENING 180 tablet 3  . triamcinolone (NASACORT ALLERGY 24HR) 55 MCG/ACT AERO nasal inhaler Place 2 sprays into the nose daily as needed.     . vitamin E 400 UNIT capsule Take 400 Units by mouth as directed. 4 times weekly    . acetaminophen-codeine (TYLENOL #4) 300-60 MG tablet Take 1 tablet by mouth every 4 (four) hours as needed for moderate pain. 30 tablet 1   Facility-Administered Medications Prior to Visit  Medication Dose Route Frequency Provider Last Rate Last Dose  . 0.9 %  sodium chloride infusion  500 mL Intravenous Continuous Meryl DareStark, Malcolm T, MD        Allergies  Allergen Reactions  . Amitiza [Lubiprostone] Hives and Shortness Of Breath  . Biaxin [Clarithromycin] Hives and Shortness Of Breath  . Iodine Swelling    Mouth and throat swelling  . Penicillins Hives and Shortness Of Breath  . Sulfa Antibiotics Hives and Shortness Of Breath  . Versed [Midazolam]   . Zithromax [Azithromycin] Hives and Shortness Of Breath  . Brimonidine Tartrate Itching and Swelling  . Lisinopril     Hair loss  . Prednisone Nausea Only    Hot flash, dizziness  . Adhesive [Tape] Rash  . Cefdinir Itching and Rash  . Levaquin [Levofloxacin In D5w] Itching and Rash    ROS As per HPI  PE: Blood pressure (!) 144/68, pulse 65, temperature 98.1 F (36.7 C), temperature source Oral, resp. rate 16, height 5' 7.5" (1.715 m), weight 106  lb 4 oz (48.2 kg), SpO2 100 %. Gen: Alert, well appearing.  Patient is oriented to person, place, time, and situation. AFFECT: pleasant, lucid thought and speech. CV: RRR, no m/r/g.   LUNGS: CTA bilat, nonlabored resps, good aeration in all lung fields. Neuro: CN 2-12 intact bilaterally, strength 5/5 in proximal and distal upper extremities and lower extremities bilaterally.  No sensory deficits.  No tremor.  No disdiadochokinesis.  No ataxia.  Upper extremity and lower extremity DTRs symmetric.  No pronator drift.   LABS:  Lab Results  Component Value Date   TSH 2.73 04/22/2016   Lab Results  Component Value Date   WBC 6.9 04/22/2016   HGB 14.1 04/22/2016   HCT 42.6 04/22/2016   MCV 88.1 04/22/2016   PLT 193.0 04/22/2016   Lab Results  Component Value Date   CREATININE 0.83 06/14/2016   BUN 25 (H) 06/14/2016   NA 142 04/22/2016   K 4.2 04/22/2016   CL 103 04/22/2016   CO2 27 04/22/2016   Lab Results  Component Value Date   ALT 19 04/22/2016   AST 19 04/22/2016   ALKPHOS 85 04/22/2016   BILITOT 0.5 04/22/2016   Lab Results  Component Value Date   CHOL 204 (H) 12/26/2015   Lab Results  Component Value Date   HDL 61.90 12/26/2015   Lab Results  Component Value Date   LDLCALC 108 (H) 12/26/2015   Lab Results  Component Value Date   TRIG 173.0 (H) 12/26/2015   Lab Results  Component Value Date   CHOLHDL 3 12/26/2015   Lab Results  Component Value Date   HGBA1C 5.4 04/13/2014    IMPRESSION AND PLAN:  Migraine syndrome: only 2-3 per month, so prophylaxis not essential, and she does not want this anyway. She has been using tylenol #4 successfully long term as abortive med if plain tylenol does not bring enough relief. She uses the tylenol #4 sparingly. Tylenol #4, #60, no RF rx'd today.  Prevnar 13 and influenza vaccine given today.  An After Visit Summary was printed and given to the patient.  FOLLOW UP: Return for none--patient moving to  [place].  Signed:  Santiago Bumpers, MD           12/13/2016

## 2016-12-26 ENCOUNTER — Encounter: Payer: Federal, State, Local not specified - PPO | Admitting: Family Medicine

## 2017-01-23 ENCOUNTER — Other Ambulatory Visit: Payer: Self-pay | Admitting: Family Medicine

## 2017-10-24 IMAGING — CT CT ABD-PELV W/ CM
2 of 5 series · 16 of 46 positions shown, 18 images · IV contrast (ISOVUE 300)
Comparison: None.

CLINICAL DATA: Periumbilical abdominal pain.

EXAM:
CT ABDOMEN AND PELVIS WITH CONTRAST
TECHNIQUE: Multidetector CT imaging of the abdomen and pelvis was performed
using the standard protocol following bolus administration of
intravenous contrast. The patient received a 13 hour steroid prep
prior to the exam due to history of contrast allergy.
CONTRAST:  100mL F5OLS4-FLL IOPAMIDOL (F5OLS4-FLL) INJECTION 61%

[Series 2: abd/pel w · axial · 0.73mm/px · z∈[-423,-98]mm · 13 of 77 slices shown, 15 images]
[im 6/77  soft-tissue]
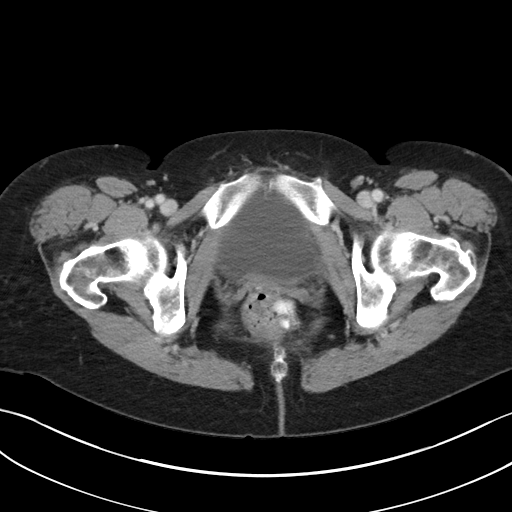
[im 6/77  bone]
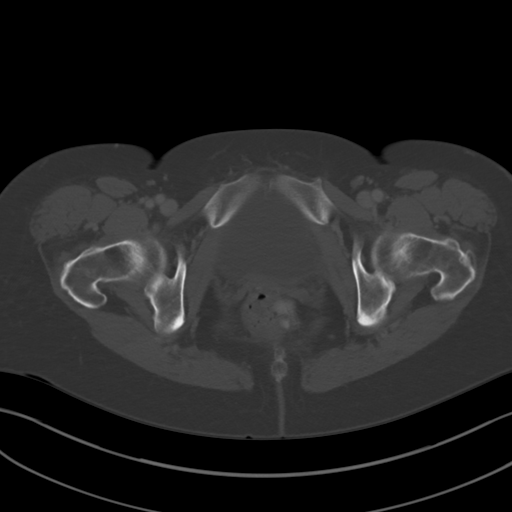
[im 11/77  soft-tissue]
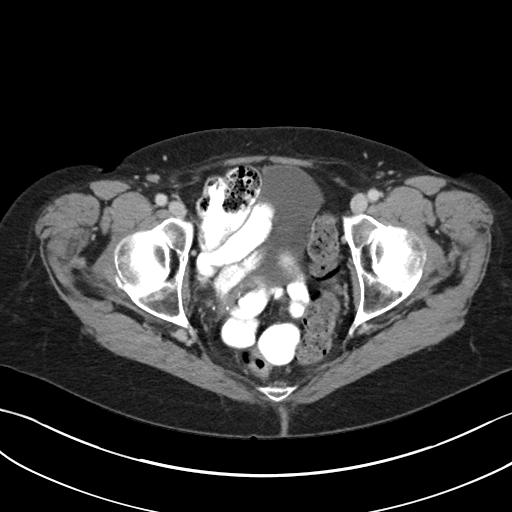
[im 17/77  soft-tissue]
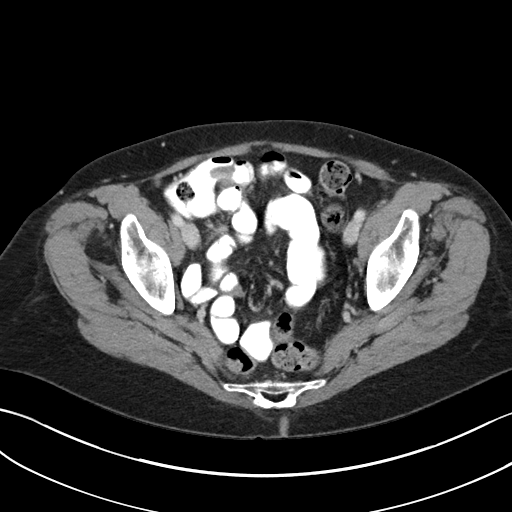
[im 22/77  soft-tissue]
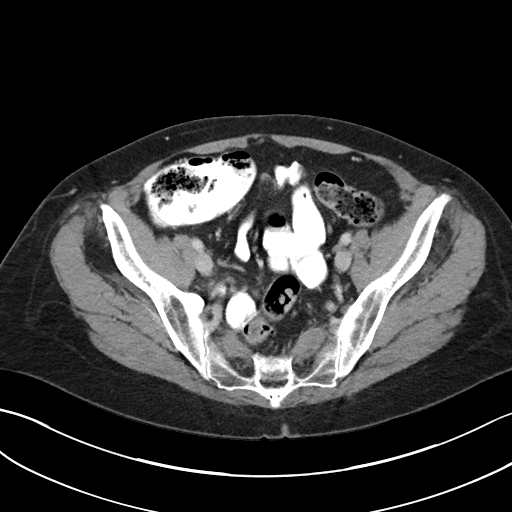
[im 28/77  soft-tissue]
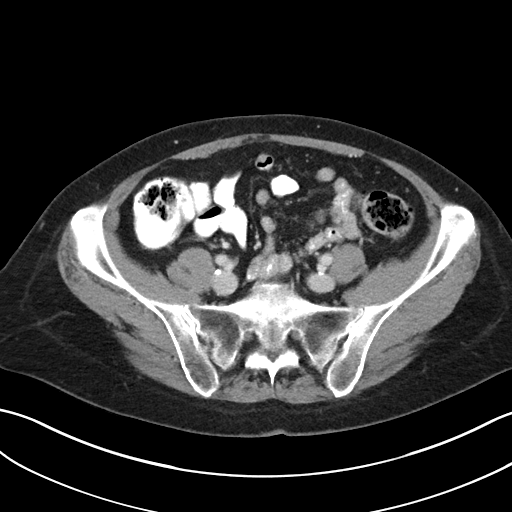
[im 33/77  soft-tissue]
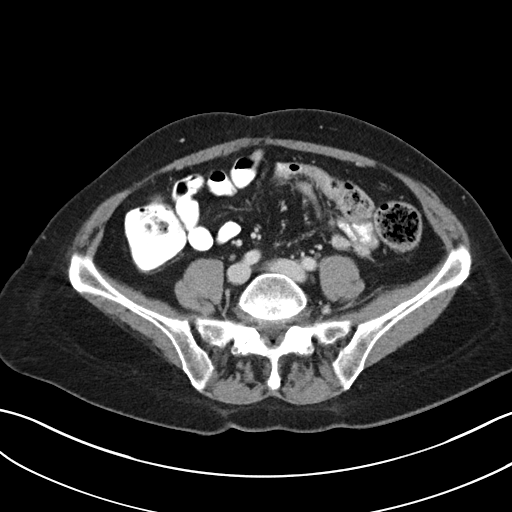
[im 39/77  soft-tissue]
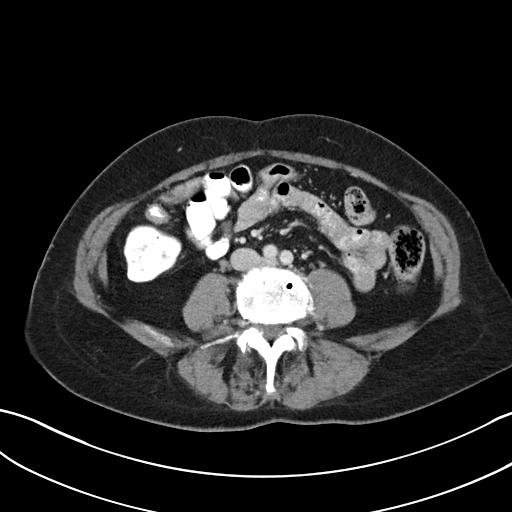
[im 44/77  soft-tissue]
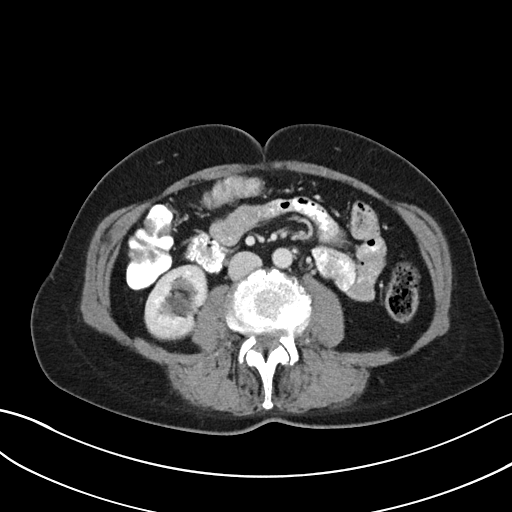
[im 49/77  soft-tissue]
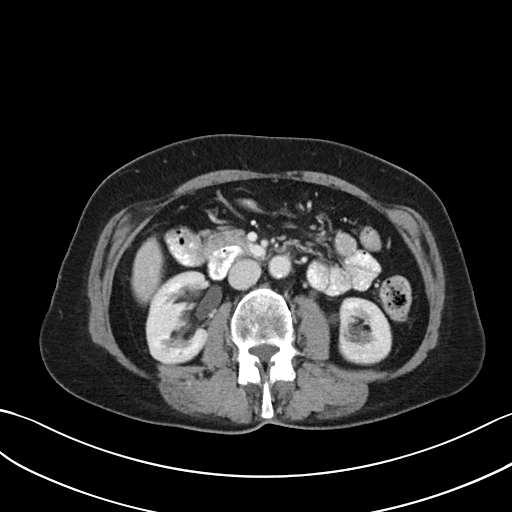
[im 49/77  bone]
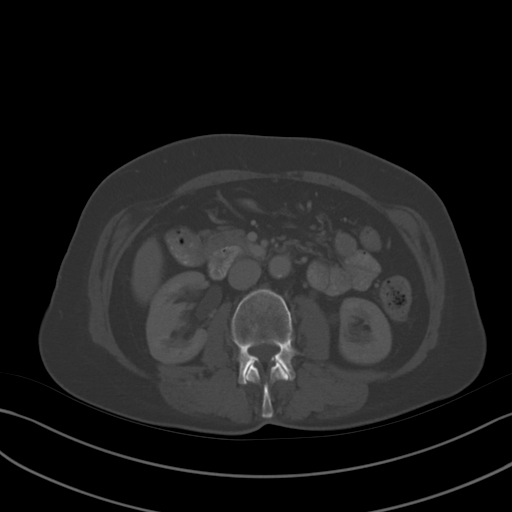
[im 55/77  soft-tissue]
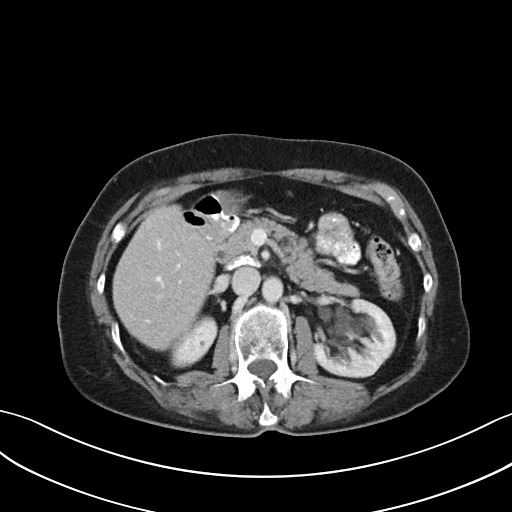
[im 60/77  soft-tissue]
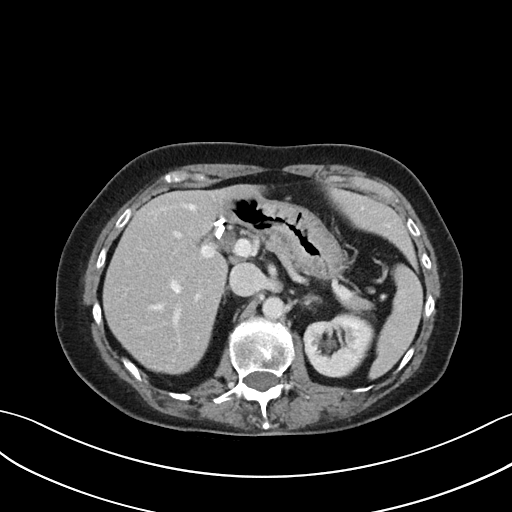
[im 66/77  soft-tissue]
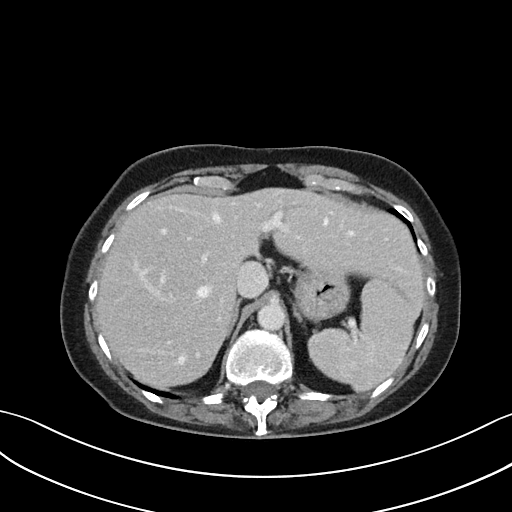
[im 71/77  soft-tissue]
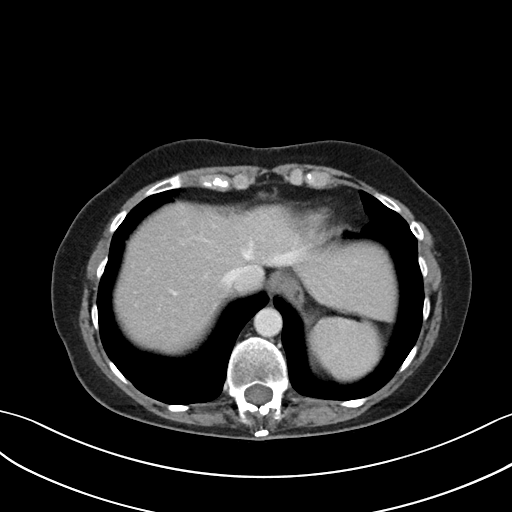

[Series 5: abd/pel w st · coronal · 0.65mm/px · 3 of 76 slices shown]
[im 26/76  soft-tissue]
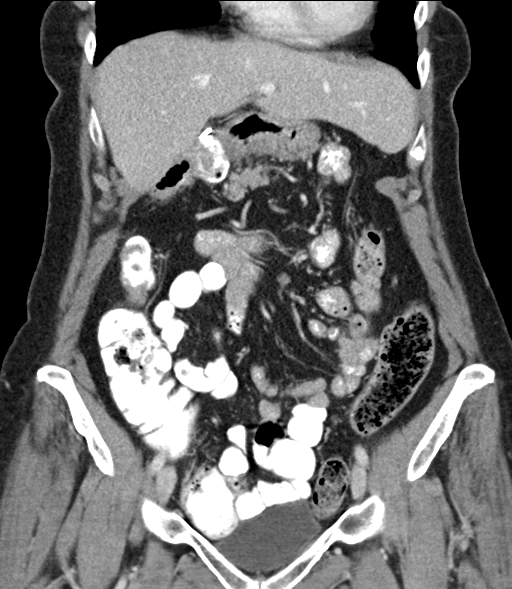
[im 34/76  soft-tissue]
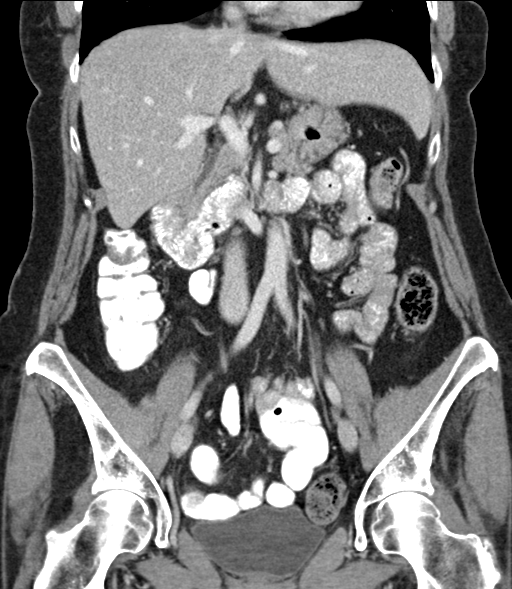
[im 42/76  soft-tissue]
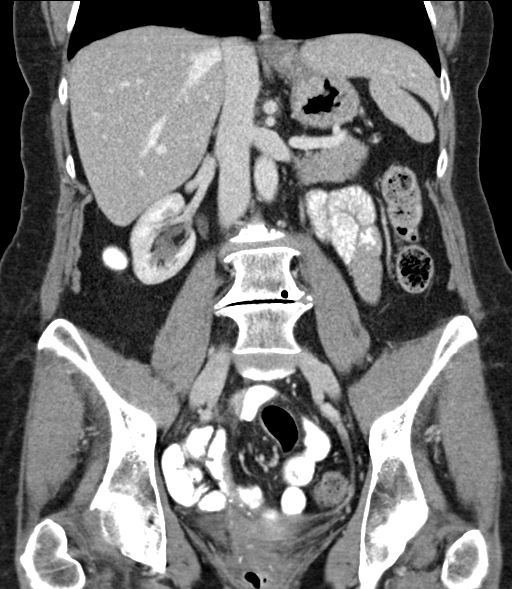

[16 of 46 positions shown; findings below may reference images not displayed]

FINDINGS: Lower Chest: No acute findings.

Hepatobiliary: No masses identified. Prior cholecystectomy noted. No
evidence of biliary dilatation.

Pancreas:  No mass or inflammatory changes.

Spleen: Within normal limits in size and appearance.

Adrenals/Urinary Tract: No masses identified. Bilateral renal sinus
cysts noted. Tiny parenchymal cyst also noted in midpole of left
kidney. No evidence of hydronephrosis. Unremarkable unopacified
urinary bladder.

Stomach/Bowel: No evidence of obstruction, inflammatory process or
abnormal fluid collections.

Vascular/Lymphatic: No pathologically enlarged lymph nodes. No
abdominal aortic aneurysm. Aortic atherosclerosis.

Reproductive: Prior hysterectomy noted. Adnexal regions are
unremarkable in appearance.

Other: No evidence of periumbilical or other ventral abdominal wall
hernia or mass. No abnormal fluid collections seen.

Musculoskeletal: No suspicious bone lesions identified. Severe
degenerative disc disease at L4-5.
IMPRESSION: No evidence of periumbilical hernia, mass, or other acute findings.

Aortic atherosclerosis.
